# Patient Record
Sex: Female | Born: 1985 | Race: White
Health system: Northeastern US, Academic
[De-identification: ages and names within clinical notes are randomized; demographics above are authoritative.]

## PROBLEM LIST (undated history)

## (undated) DIAGNOSIS — L709 Acne, unspecified: Secondary | ICD-10-CM

## (undated) HISTORY — DX: Acne, unspecified: L70.9

---

## 2008-08-13 DIAGNOSIS — M25519 Pain in unspecified shoulder: Secondary | ICD-10-CM | POA: Insufficient documentation

## 2009-05-23 ENCOUNTER — Ambulatory Visit
Admit: 2009-05-23 | Discharge: 2009-05-23 | Disposition: A | Discharge status: Home / Self Care | Payer: Self-pay | Source: Ambulatory Visit | Attending: Gastroenterology | Admitting: Gastroenterology

## 2009-05-26 ENCOUNTER — Ambulatory Visit
Admit: 2009-05-26 | Discharge: 2009-05-26 | Disposition: A | Discharge status: Home / Self Care | Payer: Self-pay | Source: Ambulatory Visit | Attending: Gastroenterology | Admitting: Gastroenterology

## 2009-06-06 ENCOUNTER — Ambulatory Visit: Payer: Self-pay | Admitting: Gastroenterology

## 2009-07-01 ENCOUNTER — Ambulatory Visit
Admit: 2009-07-01 | Discharge: 2009-07-01 | Disposition: A | Discharge status: Home / Self Care | Payer: Self-pay | Source: Ambulatory Visit | Attending: Obstetrics | Admitting: Obstetrics

## 2009-07-04 LAB — HPV DNA PROBE WITH CYTOLOGY: HPV Hybrid Capture: NEGATIVE

## 2009-07-04 LAB — GYN CYTOLOGY

## 2009-08-05 ENCOUNTER — Ambulatory Visit
Admit: 2009-08-05 | Discharge: 2009-09-01 | Disposition: A | Discharge status: Home / Self Care | Payer: Self-pay | Source: Ambulatory Visit | Attending: Psychiatry | Admitting: Psychiatry

## 2009-09-02 ENCOUNTER — Ambulatory Visit
Admit: 2009-09-02 | Discharge: 2009-09-29 | Disposition: A | Discharge status: Home / Self Care | Payer: Self-pay | Source: Ambulatory Visit | Attending: Psychiatry | Admitting: Psychiatry

## 2009-09-17 ENCOUNTER — Other Ambulatory Visit: Payer: Self-pay

## 2009-09-30 ENCOUNTER — Ambulatory Visit
Admit: 2009-09-30 | Discharge: 2009-10-30 | Disposition: A | Discharge status: Home / Self Care | Payer: Self-pay | Source: Ambulatory Visit | Attending: Psychiatry | Admitting: Psychiatry

## 2009-10-29 ENCOUNTER — Other Ambulatory Visit: Payer: Self-pay

## 2009-11-04 ENCOUNTER — Other Ambulatory Visit: Payer: Self-pay

## 2009-12-18 ENCOUNTER — Ambulatory Visit
Admit: 2009-12-18 | Discharge: 2009-12-18 | Disposition: A | Discharge status: Home / Self Care | Payer: Self-pay | Source: Ambulatory Visit | Attending: Obstetrics | Admitting: Obstetrics

## 2009-12-23 LAB — GYN CYTOLOGY

## 2010-01-15 ENCOUNTER — Ambulatory Visit: Payer: Self-pay | Admitting: Vascular Surgery

## 2010-01-20 ENCOUNTER — Ambulatory Visit: Payer: Self-pay | Admitting: Vascular Surgery

## 2010-01-21 ENCOUNTER — Ambulatory Visit: Payer: Self-pay | Admitting: Vascular Surgery

## 2010-01-27 ENCOUNTER — Encounter: Payer: Self-pay | Admitting: Vascular Surgery

## 2010-03-03 ENCOUNTER — Ambulatory Visit
Admit: 2010-03-03 | Discharge: 2010-03-03 | Disposition: A | Discharge status: Home / Self Care | Payer: Self-pay | Source: Ambulatory Visit | Admitting: Obstetrics

## 2010-03-06 LAB — GYN CYTOLOGY

## 2010-03-09 ENCOUNTER — Encounter: Payer: Self-pay | Admitting: Vascular Surgery

## 2010-04-28 ENCOUNTER — Ambulatory Visit
Admit: 2010-04-28 | Discharge: 2010-04-28 | Disposition: A | Discharge status: Home / Self Care | Payer: Self-pay | Source: Ambulatory Visit | Attending: Obstetrics & Gynecology | Admitting: Obstetrics & Gynecology

## 2010-04-28 LAB — PREGNANCY TEST, SERUM: Preg,Serum: NEGATIVE

## 2010-04-29 LAB — RPR: RPR Screen: NONREACTIVE

## 2010-04-29 LAB — HIV-1 AND 2 AB: HIV 1&2 Ab screen: NEGATIVE

## 2010-04-29 LAB — HEB B INT

## 2010-04-29 LAB — HEPATITIS A ANTIBODY, TOTAL: Hep A Total Ab: NEGATIVE

## 2010-04-29 LAB — HEPATITIS B CORE ANTIBODY, TOTAL: HBV Core Ab: NEGATIVE

## 2010-04-29 LAB — HEPATITIS B SURFACE ANTIBODY
HBV S Ab Quant: 22.3 IU/L
HBV S Ab: POSITIVE

## 2010-04-29 LAB — HEPATITIS A ANTIBODY, IGM: Hep A IgM: NEGATIVE

## 2010-04-29 LAB — HEPATITIS B SURFACE ANTIGEN: HBV S Ag: NEGATIVE

## 2010-04-29 LAB — HEPATITIS C ANTIBODY: Hep C Ab: NEGATIVE

## 2010-04-30 LAB — HSV 1 ANTIBODY, IGG: HSV 1 IgG: 0.07 IV

## 2010-05-01 LAB — VITAMIN D
25-OH VIT D2: <4 ng/mL
25-OH VIT D3: 28 ng/mL
25-OH Vit Total: 28 ng/mL — ABNORMAL LOW (ref 30–80)

## 2010-05-08 LAB — HSV 2 ANTIBODY, IGG: HSV 2 IgG: 0.04 IV

## 2010-06-02 ENCOUNTER — Ambulatory Visit
Admit: 2010-06-02 | Discharge: 2010-06-02 | Disposition: A | Discharge status: Home / Self Care | Payer: Self-pay | Source: Ambulatory Visit | Attending: Obstetrics | Admitting: Obstetrics

## 2010-06-05 LAB — GYN CYTOLOGY

## 2010-07-31 ENCOUNTER — Other Ambulatory Visit: Payer: Self-pay | Admitting: Gastroenterology

## 2010-08-10 NOTE — Letter (Signed)
January 27, 2010    Rhina Brackett, MD  [address]  [place], [place]  [address]      RE:   Cheryl Weiss, Cheryl Weiss  DOB:  [birth year]  Unit#: [id number]    Dear Dr. Consuela Mimes:    I had the pleasure of seeing Azahria Lehmann in the [facility] today.  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  She was seen initially in our office on January 18, 2010, by Dr. Thedore Mins, at  which time she was found to have both deep and superficial venous  competency of both deep and superficial venous systems.  She presents to  the office today to begin undergoing injection sclerotherapy for cosmesis.    Review of systems:  Renya points out several scattered spider  telangiectases throughout her bilateral lower extremities.  She denies any  prior history of DVT, phlebitis, and denies any known personal or family  history of hypercoagulable disorder.  She denies pregnancy.    Allergies:  Penicillin and sulfa.    Medications:  Calcium, DepoProvera, vitamin D.    All of the reasonable risks of varicose vein sclerotherapy with Asclera by  injection, including tissue loss, phlebitis, and permanent discoloration,  along with the alternatives and benefits were discussed with the patient.  All questions were answered.  The patient indicated that these risks were  understood and consented for the proposed procedure.    I used 0.5% Asclera and performed injections to multiple spider  telangiectases involving her bilateral anterior thighs and anteromedial and  lateral calves.  I used alcohol preps beforehand and bandages afterwards,  and wrapped her legs in Ace wraps for compression.    I have informed Sarynity to leave both Ace wraps and bandages on until  tomorrow, at which time she may remove both.  I have also asked that she  avoid any sun exposure or tanning as to minimize risk for  hyperpigmentation.  She is to wear a compression stocking for the next 7 to  10  days as an adjunct to her sclerotherapy.  We will plan on seeing her  back in the office following her upcoming trip to [place] and likely be  towards the end of July.    Thank you for the opportunity to participate in this patient's care.  If we  can be of any further assistance to you, please feel free to contact the  office.        Sincerely,      Dictated by:  Derek Mound, NP  Electronically Signed and Finalized by  Derek Mound, NP 02/24/2010 18:33  ____________________________________  Derek Mound, NP        DD:   01/27/2010  DT:   01/28/2010  1:17 P  [id number]  [id number]      cc:   Rhina Brackett, MD

## 2010-08-10 NOTE — Letter (Signed)
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  Her  legs are heavy, tired, and fatigued at the end of a long day.  Exercise  worsens her symptoms.  She has painful varicosities focused on the left  lower extremity.  She has a family history of varicosities on the maternal  side.  There is no preceding history of DVT, trauma, hypercoagulable state  or edema.  She has not worn compression stockings in the past.  Her past medical history is remarkable for hepatic masses.  She has had  tympanostomy tubes in the past and adenoid resection.  She does not smoke.  She socially has 1 to 2 drinks per week.  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    She has an allergy to penicillin and sulfa.    She currently takes calcium, Depo-Provera and vitamin D.  Her 10 review of system is positive for chronic irritation of her throat,  microfractures in both femurs, and varicose veins.    Noninvasive duplex scan was performed which shows that both deep and  superficial venous systems are competent and patent both supine and  standing.  Her full physical examination is documented and can be reviewed in our  chart.  Pertinent positives include that she is afebrile with stable vital  signs and appears younger than her stated age.  She is pleasant, conversant  and in no acute distress.  Arterial exam is intact.  Neurological exam  intact.  Breath sounds are clear.  Heart is regular.  Abdomen is soft and  flat.  Both legs are warm and well perfused.  She has multiple spider veins  and reticular veins involving both lower extremities that range in size  from 1 to 3 mm.  The Brodie Trendelenburg test suggests superficial  venous  reflux.  In summary, Cheryl Weiss is a delightful young lady who has symptomatic reticular  veins and spider veins involving both lower extremities.  I have given her  a prescription for thigh-high compression stockings that should help  alleviate her symptoms.  We have also scheduled her for injection  sclerotherapy to address the numerous varicosities involving both lower  extremities.  This will be done at the [facility] later  this summer.  I suspect that 1 to 2 treatment sessions will be necessary to  address them.    I look forward to keeping you updated with Neveen's progress.  Once again,  thank you for allowing me to participate in her care.  Should you have any  questions, feel free to contact me.      Sincerely yours,                Electronically Signed and Finalized by  Teena Dunk, MD 01/29/2010 07:15  ____________________________________  Teena Dunk, MD      DD:   01/18/2010  DT:   01/18/2010  1:27 P  [id number]  [id number]      cc:   Rhina Brackett, MD

## 2010-08-28 ENCOUNTER — Ambulatory Visit: Payer: Self-pay | Admitting: Gastroenterology

## 2010-09-07 ENCOUNTER — Ambulatory Visit
Admit: 2010-09-07 | Discharge: 2010-09-07 | Disposition: A | Discharge status: Home / Self Care | Payer: Self-pay | Source: Ambulatory Visit | Attending: Gastroenterology | Admitting: Gastroenterology

## 2010-09-07 LAB — COMPREHENSIVE METABOLIC PANEL
ALT: 19 U/L (ref 0–35)
AST: 19 U/L (ref 0–35)
Albumin: 4.5 g/dL (ref 3.5–5.2)
Alk Phos: 47 U/L (ref 35–105)
Anion Gap: 10 (ref 7–16)
Bilirubin,Total: 0.5 mg/dL (ref 0.0–1.2)
CO2: 28 mmol/L (ref 20–28)
Calcium: 9.3 mg/dL (ref 9.0–10.4)
Chloride: 103 mmol/L (ref 96–108)
Creatinine: 0.83 mg/dL (ref 0.51–0.95)
GFR,Black: >59 *
GFR,Caucasian: >59 *
Glucose: 60 mg/dL — ABNORMAL LOW (ref 74–106)
Lab: 12 mg/dL (ref 6–20)
Potassium: 3.9 mmol/L (ref 3.3–5.1)
Sodium: 141 mmol/L (ref 133–145)
Total Protein: 6.7 g/dL (ref 6.3–7.7)

## 2010-09-07 LAB — CK: CK: 63 U/L (ref 34–145)

## 2010-09-07 LAB — AFP TUMOR MARKER: AFP (eff. 4-2010): 2 IU/mL (ref 0–7)

## 2010-09-09 LAB — ALDOLASE: Aldolase: 3.3 U/L (ref 1.5–8.1)

## 2010-10-07 ENCOUNTER — Ambulatory Visit
Admit: 2010-10-07 | Discharge: 2010-10-07 | Disposition: A | Discharge status: Home / Self Care | Payer: Self-pay | Source: Ambulatory Visit | Attending: Obstetrics | Admitting: Obstetrics

## 2010-10-12 LAB — GYN CYTOLOGY

## 2010-10-13 ENCOUNTER — Ambulatory Visit
Admit: 2010-10-13 | Discharge: 2010-10-13 | Disposition: A | Discharge status: Home / Self Care | Payer: Self-pay | Admitting: Gastroenterology

## 2010-10-15 NOTE — Letter (Signed)
 October 13, 2010    Quintella Baton, MD  [address] [place], [place]  [address]      RE:   Cheryl, Weiss  DOB:  07-May-1986  Unit#: [id number]    Dear Dr. Julaine Hua:    We saw Cheryl Weiss in the outpatient hepatology clinic today for  follow-up of hepatic lesions.  As you know, she is a 25 year old female who  we met in May 2010 after she was incidentally found to have 3 hepatic  lesions when she was being worked up for midepigastric pain thought to be  from reflux.  The lesions have been suggestive of focal nodular hyperplasia  versus multifocal adenomas.  We asked her to go off all oral contraceptives  and have been following her with imaging and blood work.  Since May 2010  she has had 2 MRIs of the abdomen.  The MRI in October 2010 suggested that  she continued to have the 3 lesions, 1 slightly smaller in size, 1 slightly  larger, and 1 unchanged.  They also felt they were consistent with focal  nodular hyperplasia.    Patient had recent MRI of the abdomen in January 2012 and this one revealed  the 3 enhancing lesions once again.  Two of these lesions were mildly  increased in size compared to past and one was mildly decreased.  The  lesion in the left hepatic lobe represented a degree of interval involution  or scar or interval changes in contrast appearance which were thought to be  secondary to differences in phase of contrast.  She also was found to have  2 subcentimeter hepatic cysts not changed and a splenic cyst not changed.    Patient had routine blood work drawn by her primary care physician in  January which revealed a mild transaminitis.  Patient was contacted by  phone and at that point the patient revealed that she was doing an  extensive physical workout with cardio and weight lifting.  She was also  taking multiple medications some of which can be hepatotoxic.  Her ALT was  77 and AST 60.  Prior to this her liver enzymes had all been completely  normal.  I asked her to discontinue all of her  medications with the  exception of her clindamycin topical gel and to not exercise for 1 straight  week and repeat all of her blood work.  She did repeat her blood work after  about 10 days which revealed normal liver enzymes with no evidence of  transaminitis and her CK was normal.    Patient comes in today with no complaints.  She looks and feels well.  She  denies any abdominal pain, nausea, or vomiting.  She has had no  unintentional weight loss and has no jaundice, pruritus, easy bruising, or  prolonged bleeding.  Patient reports that her reflux previously causing  epigastric pain is now gone with dietary modifications.    RELEVANT LABS:  From 09/07/2010:  CK 63, creatinine 0.83, total bilirubin  0.5, AST 19, ALT 19, alkaline phosphatase 47, aldolase 3.3,  alpha-fetoprotein 2.    IMAGING:  MRI of the abdomen with Eovist contrast:  3 enhancing liver  lesions, in the right hepatic lobe near the dome measures 2.2 x 2.1  (previously 2.0 x 1.8), second lesion in the right hepatic lobe measuring  4.5 x 3.5 (previously 4.9 x 4.1), a third lesion in the posterior aspect of  the liver at the junction of  the left and right hepatic lobe measuring 4.1  x 3.7 (previously 3.5 x 3.5).  The more superior lesions have a suggestion  of central scar and are more homogeneous in delayed images.  The more  inferior lesions demonstrate a greater degree of heterogenicity.  No new  liver lesions identified.    CURRENT MEDICATIONS:  Clindamycin gel 1%.  Patient was previously taking  and asked to discontinue niacin 500 mg, milk thistle, OxyELITE Pro, B  complex, calcium, vitamin D, and magnesium.    PHYSICAL EXAMINATION:  Blood pressure:  106/52.  Pulse:  85.  Weight:  167.  Height:  6 feet zero inches.  HEENT:  Sclerae are anicteric.  Oral mucosa  is moist.  Cor:  Regular rate and rhythm.  Lungs:  Clear.  Abdomen:  Soft,  thin, nontender.  No organomegaly.  Extremities:  No edema.  Neuro:  Alert  and oriented with no focal  defects.    ASSESSMENT AND RECOMMENDATION:  This is a 25 year old female with a history  of 3 liver lesions found incidentally in May 2010 when she was being worked  up for epigastric pain from reflux.  We have been following the lesions  closely with imaging and although 2 appear to be consistent with focal  nodular hyperplasia (patient off oral contraceptives since diagnosis) 1 has  an atypical appearance and is suggestive of adenoma versus FNH.  The lesion  in her left lobe has had slow interval growth.  Her liver enzymes are  normal as are her tumor markers.  She had mild transaminitis seen on labs  from January 2012 thought to be from over-the-counter medications including  niacin, milk thistle, and OxyELITE Pro, which she has discontinued with  normalization of her blood work.  My recommendations include:     1. Liver lesions.  At this point I have spent a great deal of time with        the patient discussing her 3 liver lesions.  We will repeat the MRI        of her abdomen in May 2012, as she is going on vacation for 2 weeks        in June.  If she has had any further growth in her liver lesions        strong consideration will be made to liver biopsy for tissue        diagnosis and/or possible resection of her lesions.  Patient is        agreeable to plan.  I will repeat her blood work in 1 month's time        and in 3 months' time to assess the stability of her liver enzymes,        as they were previously abnormal (thought to be from medications).     2. Reflux.  Patient currently has no reflux and has remained off        Nexium.     3. I will see her back for follow-up after her MRI in May 2012.    Thank you for allowing me to participate in her care.  Please do not  hesitate to call with questions or concerns at [phone].        Sincerely,      Dictated by:  Thereasa Distance, NP  Electronically Signed and Finalized by  Thereasa Distance, NP 10/16/2010 09:28  ____________________________________  Thereasa Distance, NP        DD:   10/15/2010  DT:   10/15/2010  4:24 P  [id number]  [id number]      cc:   Quintella Baton, MD

## 2011-01-01 ENCOUNTER — Other Ambulatory Visit: Payer: Self-pay | Admitting: Gastroenterology

## 2011-01-01 DIAGNOSIS — K769 Liver disease, unspecified: Secondary | ICD-10-CM

## 2011-02-09 ENCOUNTER — Ambulatory Visit: Admit: 2011-02-09 | Payer: Self-pay | Source: Ambulatory Visit | Admitting: Gastroenterology

## 2011-03-23 ENCOUNTER — Telehealth: Payer: Self-pay

## 2011-03-23 NOTE — Telephone Encounter (Signed)
Returned patient's call regarding expired prior authorization for MRI.  Imaging was originally scheduled in June but she missed the appointment. Contacted [employer] and obtained new prior authorization [id number] and called patient with authorization number so that she can reschedule MRI.  Informed her that approval will expired on 05/07/11 and provided her with phone number to [facility].

## 2011-03-26 ENCOUNTER — Other Ambulatory Visit: Payer: Self-pay | Admitting: Gastroenterology

## 2011-04-08 ENCOUNTER — Telehealth: Payer: Self-pay

## 2011-04-08 NOTE — Telephone Encounter (Signed)
Patient is scheduled for MRI on 9/12 however [employer] gave authorization for CT.  Contacted [employer] and obtained approval for MRI and authorization # is [id number] (expires 05/23/11). Provided correct authorization number to Mia in Radiology Scheduling.

## 2011-04-14 ENCOUNTER — Ambulatory Visit
Admit: 2011-04-14 | Discharge: 2011-04-14 | Disposition: A | Discharge status: Home / Self Care | Payer: Self-pay | Source: Ambulatory Visit | Attending: Gastroenterology | Admitting: Gastroenterology

## 2011-04-14 LAB — POCT URINE PREGNANCY: Lot #: 209471

## 2011-04-14 LAB — POCT CREATININE
Creatinine, POCT: 0.8 mg/dL (ref 0.51–0.95)
GFR, OTHER, POC: >59

## 2011-04-20 ENCOUNTER — Telehealth: Payer: Self-pay | Admitting: Gastroenterology

## 2011-04-21 NOTE — Telephone Encounter (Signed)
Patient called back for MRI results.  She is extremely anxious.

## 2011-04-22 ENCOUNTER — Telehealth: Payer: Self-pay | Admitting: Gastroenterology

## 2011-05-20 NOTE — Telephone Encounter (Signed)
Sent to provider 

## 2011-07-09 ENCOUNTER — Ambulatory Visit
Admit: 2011-07-09 | Discharge: 2011-07-09 | Disposition: A | Discharge status: Home / Self Care | Payer: Self-pay | Source: Ambulatory Visit | Attending: Obstetrics | Admitting: Obstetrics

## 2011-07-19 LAB — HPV DNA PROBE WITH CYTOLOGY: HPV Hybrid Capture: NEGATIVE

## 2011-07-22 LAB — GYN CYTOLOGY

## 2011-09-13 ENCOUNTER — Encounter: Payer: Self-pay | Admitting: Dermatology

## 2011-09-13 ENCOUNTER — Ambulatory Visit: Payer: Self-pay | Admitting: Dermatology

## 2011-09-13 VITALS — BP 100/70 | Ht 72.0 in | Wt 168.0 lb

## 2011-09-13 DIAGNOSIS — L709 Acne, unspecified: Secondary | ICD-10-CM

## 2011-09-13 MED ORDER — CLINDAMYCIN PHOS-BENZOYL PEROX 1-5 % EX GEL *A*
Freq: Two times a day (BID) | CUTANEOUS | Status: DC
Start: 2011-09-13 — End: 2013-06-06

## 2011-09-13 MED ORDER — MINOCYCLINE HCL 100 MG PO CAPS *I*
100.0000 mg | ORAL_CAPSULE | Freq: Two times a day (BID) | ORAL | Status: AC
Start: 2011-09-13 — End: 2011-10-13

## 2011-09-13 MED ORDER — TRETINOIN 0.025 % EX CREA *I*
TOPICAL_CREAM | Freq: Every evening | CUTANEOUS | Status: DC
Start: 2011-09-13 — End: 2013-06-06

## 2011-09-13 NOTE — Patient Instructions (Addendum)
Acne  Start Tretinoin 0.025 : Use a pea sized amount every other night for two weeks, then advance to nightly as tolerated. Wash off in the morning. May cause drying or redness.  Start Benzaclin: Use once in the morning. Can bleach clothing   Take mincocycline 100mg  by mouth twice daily. Take WITH food, this will help prevent stomach upset. Avoid increased sun exposure while on this medication. Alert [place] if you experience dizziness or headache. Pigmentation changes may occur if this medication is used for an extended period of time. Do not become pregnant on this medication.   -Use moisturizer made for the face. These products should say "non-comedogenic" or "won't clog pores". SPF >30 for daily use.  Reapply frequently  while in the sun or active.     Follow up in 3 months

## 2011-09-13 NOTE — Progress Notes (Addendum)
Consulting Physician:   Quintella Baton    CC: acne      Identifiers:  2 Patient identification verification occurred.  Barriers to learning:  None    HPI:  This is a very pleasant 26 year old female here as a new patient for acne.  She reports that she was on oral contraceptive for several years, however 2 years ago she had to discontinue it because she was found to have focal nodular hyperplasia of the liver that was caused by estrogen in the birth control. When she stopped the OCP, her acne became terrible. Strong family history of bad acne in brother and sister. She has tried proactive and topical benzaclin, but these were not sufficient to control her acne.     ROS:   Otherwise well, other than the above stated in the HPI. No other skin concerns       Past Medical/Surgical History: The patient's medical record and intake form from today were reviewed.  No medical problems  Medications: Multivitamin  Allergies: Sulfa, penicillin    Family History: The intake form from today was reviewed.    No melanoma or nonmelanoma skin cancers.      Social History:    The intake form from today was reviewed.  Patient drinks alcohol 1-2 times weekly.  Denies IV drug or tobacco use.  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     Physical Exam:    The patient is alert, well-nourished, well-developed, no acute distress   All of the following were examined and were within normal limits, except as noted:     Face, Ears, Neck:  --On the cheeks, chin, forehead and nose there are multiple inflammatory papules, open and closed comedones.  Intermixed is postinflammatory hyperpigmentation.  Scalp,Hair, Eyebrows:  Eyes, Lids, Conjunctivae:     Chest, Anterior and posterior trunk:  A few inflammatory papules on the back.  Chest is clear          Assessment/Plan:    1.Acne  Start Tretinoin 0.025 : Use a pea sized amount every other night for two weeks, then advance to nightly as tolerated. Wash off in the morning. May cause  drying or redness.  Start Benzaclin: Use once in the morning. Can bleach clothing   Take mincocycline 100mg  by mouth twice daily.  Discussed side effects      Follow up in 3 months         Francisco Capuchin, MD    Agree with note of Dr.Moon. Patient evaluated jointly.  Would manage as noted.    Edwardine Deschepper Arlis Porta, MD

## 2011-11-18 ENCOUNTER — Other Ambulatory Visit: Payer: Self-pay | Admitting: Gastroenterology

## 2011-11-18 DIAGNOSIS — R748 Abnormal levels of other serum enzymes: Secondary | ICD-10-CM

## 2011-12-28 ENCOUNTER — Ambulatory Visit: Payer: Self-pay | Admitting: Dermatology

## 2012-03-02 ENCOUNTER — Ambulatory Visit: Payer: Self-pay | Admitting: Surgery

## 2012-03-02 ENCOUNTER — Encounter: Payer: Self-pay | Admitting: Surgery

## 2012-03-02 VITALS — BP 114/76 | Ht 72.0 in | Wt 153.0 lb

## 2012-03-02 DIAGNOSIS — L578 Other skin changes due to chronic exposure to nonionizing radiation: Secondary | ICD-10-CM

## 2012-03-02 DIAGNOSIS — L709 Acne, unspecified: Secondary | ICD-10-CM

## 2012-03-02 DIAGNOSIS — D229 Melanocytic nevi, unspecified: Secondary | ICD-10-CM | POA: Insufficient documentation

## 2012-03-02 NOTE — Patient Instructions (Signed)
A- Asymmetry  B- Borders  C- Color  D- Diameter bigger than a pencil eraser head    We don't like red, or bluish black or rapidly growing lesions  If it's new and hasn't gone away in a month please call  If it looks like many other lesions on your body it's probably nothing to worry about.

## 2012-03-02 NOTE — Progress Notes (Signed)
Chief Complaint   Patient presents with   . Follow-up     mole R buttock   . Acne         Identifiers:  2 Patient identification verification occurred.  Barriers to learning:  None    HPI:  Cheryl Weiss is a 26 y.o. year old female following up for acne.  At last visit the patient was treated with minocycline, benzaclin and tretinoin.  She stopped taking the minocycline d/t diarrhea and gi upset.  She has a mole of the right buttock that has more than two colors in it.  Otherwise asymptomatic.  One lesion of the buttock near the crease.  Not symptomatic    ROS:   Otherwise well, other than the above stated in the HPI.    PMFSH: Reviewed. No changes.  Meds:  Reviewed  Physical Exam:    The patient is alert, well-nourished, well-developed, no acute distress   All of the following were examined and were within normal limits, except as noted:     Face, Ears, Neck:  Atrophic scars of the face, few erythematous papules, extremely tanned skin.  One small 3mm round med brown homogenous papule of the right buttock.  One fibromatous papule 0.5 cm of the right buttock    Assessment/Plan:    1.  Acne, well controlled on topicals, d/c mino.  F/u one year  2.  Nevus and Dermatofibroma benign.  Ed. On the ABCD's of skin cancer     Follow up prn

## 2013-04-25 ENCOUNTER — Ambulatory Visit
Admit: 2013-04-25 | Discharge: 2013-04-25 | Disposition: A | Discharge status: Home / Self Care | Payer: Self-pay | Source: Ambulatory Visit | Attending: Obstetrics and Gynecology | Admitting: Obstetrics and Gynecology

## 2013-04-26 LAB — BHCG, QUANT PREGNANCY

## 2013-06-06 ENCOUNTER — Ambulatory Visit: Payer: Self-pay | Admitting: Dermatology

## 2013-06-06 ENCOUNTER — Encounter: Payer: Self-pay | Admitting: Dermatology

## 2013-06-06 VITALS — Ht 72.0 in | Wt 155.0 lb

## 2013-06-06 DIAGNOSIS — L709 Acne, unspecified: Secondary | ICD-10-CM

## 2013-06-06 DIAGNOSIS — B351 Tinea unguium: Secondary | ICD-10-CM

## 2013-06-06 MED ORDER — KETOCONAZOLE 2 % EX CREA *I*
TOPICAL_CREAM | Freq: Every day | CUTANEOUS | Status: AC
Start: 2013-06-06 — End: ?

## 2013-06-06 MED ORDER — TRETINOIN 0.025 % EX CREA *I*
TOPICAL_CREAM | Freq: Every evening | CUTANEOUS | Status: AC
Start: 2013-06-06 — End: ?

## 2013-06-06 MED ORDER — CIPROFLOXACIN HCL 500 MG PO TABS *I*
500.0000 mg | ORAL_TABLET | Freq: Two times a day (BID) | ORAL | Status: AC
Start: 2013-06-06 — End: 2013-06-16

## 2013-06-06 MED ORDER — CLINDAMYCIN PHOS-BENZOYL PEROX 1-5 % EX GEL *A*
Freq: Two times a day (BID) | CUTANEOUS | Status: AC
Start: 2013-06-06 — End: ?

## 2013-06-06 NOTE — Patient Instructions (Addendum)
1. Green discoloration of the left great toenail: Onychomycosis (toenail fungus) vs pseudomonas or canidiasis   --Culture of nail taken today (takes 2-4 weeks to grow)   --Ciprofloxacin 500mg  twice daily for 10 days   --If your GI doctor is ok with terbinafine (lamisil), pending culture results, this would be best to treat if fungus.   --Also check if fluconazole is ok, in case of yeast infection (Candidiasis)   --When checking blood work with PCP, make sure you get a CBCd and a CMP   --Start vinegar soaks: 1/4 cup vinegar, 3/4 cup water   --Start ketoconazole cream twice daily     2. Acne   --Continue benzaclin and retin-a       Follow up in 2 months

## 2013-06-06 NOTE — Progress Notes (Signed)
Chief Complaint   Patient presents with   . Follow-up     L big toe nail discoloration          Identifiers:  2 Patient identification verification occurred.  Barriers to learning:  None    HPI:  Cheryl Weiss is a 27 y.o. year old female following up for the above. She has also been treated by our practice for acne, last seen in August 2013. Does well with tretinoin and benzaclin. WOuld like refills. Also has issues with toe pain and green discoloration. Has been progressively worsening over the past few months. 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 She has benign liver tumors and history of elevated LFTs. Has not seen GI in a few years.       ROS:   Otherwise well, other than the above stated in the HPI.    PMFSH: Reviewed. No changes.  Meds:  Reviewed  Physical Exam:    The patient is alert, well-nourished, well-developed, no acute distress   All of the following were examined and were within normal limits, except as noted:     Face, Ears, Neck:  Atrophic scars of the face, few erythematous papules   --left great toenail with dystrohpy, green discoloration, assoicated paryhoncyhia     Assessment/Plan:   1. Green discoloration of the left great toenail: Onychomycosis (toenail fungus) vs pseudomonas or canidiasis   --Culture of nail taken today (takes 2-4 weeks to grow)   --Ciprofloxacin 500mg  twice daily for 10 days   --If your GI doctor is ok with terbinafine (lamisil), pending culture results, this would be best to treat if fungus.   --Also check if fluconazole is ok, in case of yeast infection (Candidiasis)   --When checking blood work with PCP, make sure you get a CBCd and a CMP   --Start vinegar soaks: 1/4 cup vinegar, 3/4 cup water   --Start ketoconazole cream twice daily     2. Acne   --Continue benzaclin and retin-a       Follow up in 2 months

## 2013-06-25 LAB — FUNGUS CULTURE: Fungal Culture: 0

## 2013-07-13 ENCOUNTER — Emergency Department
Admit: 2013-07-13 | Discharge: 2013-07-14 | Disposition: A | Discharge status: Home / Self Care | Payer: Self-pay | Source: Ambulatory Visit | Attending: Emergency Medicine | Admitting: Emergency Medicine

## 2013-07-13 DIAGNOSIS — N390 Urinary tract infection, site not specified: Secondary | ICD-10-CM

## 2013-07-13 LAB — POCT URINE PREGNANCY: Exp date: 700888

## 2013-07-13 LAB — URINALYSIS, DIPSTICK ONLY (STRONG WEST)
Ketones, UA: NEGATIVE
Nitrite,UA: POSITIVE — AB
Protein,UA: 100 mg/dL — AB
Specific Gravity,UA: 1.01 (ref 1.002–1.030)
pH,UA: 6.5 (ref 5.0–8.0)

## 2013-07-13 LAB — HM HIV SCREENING OFFERED

## 2013-07-13 MED ORDER — KETOROLAC TROMETHAMINE 30 MG/ML IJ SOLN *I*
60.0000 mg | Freq: Once | INTRAMUSCULAR | Status: AC
Start: 2013-07-14 — End: 2013-07-13
  Administered 2013-07-13: 60 mg via INTRAMUSCULAR
  Filled 2013-07-13: qty 2

## 2013-07-13 NOTE — ED Notes (Signed)
Reports history of UTI.  Reports same symptoms and believes has stress induced UTI due to final exams.  Reports increased urgency and frequency.  Reports pain L/R flank without CVA tenderness.  Dishcarge white, clumpy with blood.  Denies fever.  Reports headache x 1 day.

## 2013-07-13 NOTE — ED Notes (Signed)
Patient presents to the ED with complaint of UTI symptoms, patient states she has a history of UTI's.  Patient states she has had burning when she urinated for the past 2 days, states she noticed blood in her urine in the past 4 hours and has had an increase in frequency in the past 4 hours.  Patient states there is an odor, also states she has white discharge with blood.  Patient denies any abdominal pain.  Patient states she has right and left flank pain that started in the past 4 hours, patient does not have CVA tenderness.  Patient denies any nausea or vomiting.  Patient states she has had a headache today, denies any dizziness, lightheadedness, blurry vision or double vision.  Patient changing into a gown.

## 2013-07-14 ENCOUNTER — Encounter: Payer: Self-pay | Admitting: Emergency Medicine

## 2013-07-14 MED ORDER — CIPROFLOXACIN HCL 500 MG PO TABS *I*
500.0000 mg | ORAL_TABLET | Freq: Once | ORAL | Status: AC
Start: 2013-07-14 — End: 2013-07-14
  Administered 2013-07-14: 500 mg via ORAL
  Filled 2013-07-14: qty 1

## 2013-07-14 MED ORDER — PHENAZOPYRIDINE HCL 100 MG PO TABS *I*
200.0000 mg | ORAL_TABLET | Freq: Once | ORAL | Status: AC
Start: 2013-07-14 — End: 2013-07-14
  Administered 2013-07-14: 200 mg via ORAL
  Filled 2013-07-14: qty 2

## 2013-07-14 MED ORDER — CIPROFLOXACIN HCL 500 MG PO TABS *I*
500.0000 mg | ORAL_TABLET | Freq: Two times a day (BID) | ORAL | Status: AC
Start: 2013-07-14 — End: 2013-07-21

## 2013-07-14 NOTE — Discharge Instructions (Signed)
Take Cipro twice daily for 7 days.  Take pyridium for bladder pain- this is available over the counter. Ask pharmacist.  Cheryl Weiss had a dose of both tonight.  If you are not feeling better in 48 hours, return to the ED.

## 2013-07-14 NOTE — ED Provider Notes (Signed)
History     Chief Complaint   Patient presents with   . Urinary Tract Infection     HPI Comments: CC: dysuria  HPI: 27yo female with burning pain when she urinates, some hematuria, urgency, frequency and pain in both flanks that comes and goes. She has hx of utis with stress. No fevers. Positive chills.       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 Acne             History reviewed. No pertinent past surgical history.    History reviewed. No pertinent family history.      Social History      reports that she has never smoked. She does not have any smokeless tobacco history on file. She reports that she drinks about 0.6 ounces of alcohol per week. She reports that she does not use illicit drugs. Her sexual activity history is not on file.    Living Situation    Questions Responses    Patient lives with     Homeless     Caregiver for other family member     External Services     Employment     Domestic Violence Risk           Problem List     Patient Active Problem List   Diagnosis Code   . Compression Arthralgia Of The Shoulder Region 719.41   . Acne 706.1   . Nevus 216.9   . Sun-damaged skin 692.79       Review of Systems   Review of Systems   Constitutional: Negative for fever, chills, diaphoresis, activity change, fatigue and unexpected weight change.   HENT: Negative for hearing loss, nosebleeds, congestion, rhinorrhea, neck pain, neck stiffness and voice change.    Eyes: Negative for photophobia and visual disturbance.   Respiratory: Negative for apnea, cough, choking, chest tightness, shortness of breath, wheezing and stridor.    Cardiovascular: Negative for chest pain, palpitations and leg swelling.   Gastrointestinal: Negative for nausea, vomiting, abdominal pain, diarrhea, constipation, blood in stool, abdominal distention and anal bleeding.   Genitourinary:  Positive for dysuria, urgency, frequency, hematuria and flank pain. Negative for decreased urine volume, vaginal bleeding, vaginal discharge, difficulty urinating, vaginal pain, menstrual problem and pelvic pain.   Musculoskeletal: Negative for myalgias, back pain and arthralgias.   Skin: Negative for color change, rash and wound.   Neurological: Negative.  Negative for weakness and light-headedness.   Psychiatric/Behavioral: Negative for suicidal ideas, behavioral problems, sleep disturbance, self-injury and agitation. The patient is not nervous/anxious and is not hyperactive.        Physical Exam     ED Triage Vitals   BP Heart Rate Heart Rate(via Pulse Ox) Resp Temp Temp Source SpO2 O2 Device O2 Flow Rate   07/13/13 2323 07/13/13 2323 07/13/13 2323 -- 07/13/13 2323 07/13/13 2323 07/13/13 2323 07/13/13 2323 --   128/73 mmHg 144 144  37.1 C (98.8 F) TEMPORAL 100 % None (Room air)       Weight           07/13/13 2323           70.308 kg (155 lb)               Physical Exam  Nursing note and vitals reviewed.  Constitutional: She is oriented to person, place, and time. She appears well-developed and well-nourished.   HENT:   Head: Normocephalic and atraumatic.   Nose: Nose normal.   Mouth/Throat: Oropharynx is clear and moist.   Eyes: Conjunctivae and EOM are normal. Pupils are equal, round, and reactive to light.   Neck: Normal range of motion. Neck supple.   Cardiovascular: Normal rate, regular rhythm, normal heart sounds and intact distal pulses.    Pulmonary/Chest: Effort normal and breath sounds normal. No stridor. She has no wheezes. She has no rales.   Abdominal: Soft. Bowel sounds are normal. She exhibits no distension. There is no tenderness. There is CVA tenderness. There is no rebound and no guarding.   Musculoskeletal: Normal range of motion.   Neurological: She is alert and oriented to person, place, and time.   Skin: Skin is dry.   Psychiatric: She has a normal mood and affect. Her behavior is normal.  Judgment and thought content normal.       Medical Decision Making   <EDMDM>    Initial Evaluation:  ED First Provider Contact    Date/Time Event User Comments    07/13/13 2327 ED Provider First Contact Chalice Philbert S Initial Face to Face Provider Contact          Patient seen by me on arrival date of 07/13/2013 at at time of arrival  11:17 PM.  Initial face to face evaluation time noted above may be discrepant due to patient acuity and delay in documentation.    Assessment:  27 y.o., female comes to the ED with dysuria    Differential Diagnosis includes uti, pyelo              Plan: ua, ucx, urine preg    Labs Reviewed   URINALYSIS, DIPSTICK ONLY ([facility]) - Abnormal; Notable for the following:     Blood,UA 3+ (*)     Protein,UA 100 (*)     Nitrite,UA POS (*)     Leuk Esterase,UA 1+ (*)     All other components within normal limits   AEROBIC CULTURE   POCT URINE PREGNANCY     Pt has urine dip consistent with UTI. Given flank pain want to cover for pyelo. She is tolerating PO so should be able to be treated as outpt. We discussed due to allergies, cipro is preferred over macrobid as two main agents due to poor kidney penetration of macrobid. We discussed risk of cipro causing ligament injury. She understands. Will take cipro for 3 days and if not better continue for full 7. Culture here so if not feeling better in 48 hours should return or see her doctor.      Harrietta Guardian, MD          Harrietta Guardian, MD  07/14/13 [phone]

## 2013-07-16 LAB — AEROBIC CULTURE

## 2013-08-07 ENCOUNTER — Ambulatory Visit: Payer: Self-pay | Admitting: Dermatology

## 2014-11-29 ENCOUNTER — Ambulatory Visit
Admit: 2014-11-29 | Discharge: 2014-11-29 | Disposition: A | Discharge status: Home / Self Care | Payer: Self-pay | Source: Ambulatory Visit | Attending: Internal Medicine | Admitting: Internal Medicine

## 2014-11-29 LAB — TIBC
Iron: 126 ug/dL (ref 34–165)
TIBC: 349 ug/dL (ref 250–450)
Transferrin Saturation: 36 % (ref 15–50)

## 2014-11-29 LAB — CBC
Hematocrit: 43 % (ref 34–45)
Hemoglobin: 14.4 g/dL (ref 11.2–15.7)
MCH: 33 pg/cell — ABNORMAL HIGH (ref 26–32)
MCHC: 34 g/dL (ref 32–36)
MCV: 99 fL — ABNORMAL HIGH (ref 79–95)
Platelets: 257 10*3/uL (ref 160–370)
RBC: 4.3 MIL/uL (ref 3.9–5.2)
RDW: 12.7 % (ref 11.7–14.4)
WBC: 5.9 10*3/uL (ref 4.0–10.0)

## 2014-11-29 LAB — FERRITIN: Ferritin: 32 ng/mL (ref 10–120)

## 2014-11-29 LAB — TSH: TSH: 1.35 u[IU]/mL (ref 0.27–4.20)

## 2015-10-22 ENCOUNTER — Emergency Department: Payer: Medicaid - Out of State

## 2015-10-22 ENCOUNTER — Emergency Department
Admission: EM | Admit: 2015-10-22 | Discharge: 2015-10-22 | Disposition: A | Discharge status: Home / Self Care | Payer: Medicaid - Out of State | Attending: Emergency Medicine | Admitting: Emergency Medicine

## 2015-10-22 ENCOUNTER — Encounter: Payer: Self-pay | Admitting: Emergency Medicine

## 2015-10-22 DIAGNOSIS — J209 Acute bronchitis, unspecified: Secondary | ICD-10-CM | POA: Diagnosis not present

## 2015-10-22 DIAGNOSIS — Z880 Allergy status to penicillin: Secondary | ICD-10-CM | POA: Diagnosis not present

## 2015-10-22 DIAGNOSIS — J0100 Acute maxillary sinusitis, unspecified: Secondary | ICD-10-CM | POA: Insufficient documentation

## 2015-10-22 DIAGNOSIS — R05 Cough: Secondary | ICD-10-CM | POA: Diagnosis present

## 2015-10-22 MED ORDER — ALBUTEROL SULFATE (2.5 MG/3ML) 0.083% IN NEBU
2.5000 mg | INHALATION_SOLUTION | Freq: Once | RESPIRATORY_TRACT | Status: AC
  Administered 2015-10-22: 2.5 mg via RESPIRATORY_TRACT
  Filled 2015-10-22: qty 3

## 2015-10-22 MED ORDER — GUAIFENESIN-CODEINE 100-10 MG/5ML PO SYRP: 5.0000 mL | ORAL_SOLUTION | Freq: Three times a day (TID) | ORAL | Status: AC | PRN

## 2015-10-22 MED ORDER — PREDNISONE 10 MG PO TABS: 50.0000 mg | ORAL_TABLET | Freq: Every day | ORAL | Status: AC

## 2015-10-22 MED ORDER — AZITHROMYCIN 250 MG PO TABS: ORAL_TABLET | ORAL | Status: AC

## 2015-10-22 MED ORDER — ALBUTEROL SULFATE HFA 108 (90 BASE) MCG/ACT IN AERS: 2.0000 | INHALATION_SPRAY | RESPIRATORY_TRACT | Status: AC | PRN

## 2015-10-22 NOTE — ED Notes (Signed)
Cough for about a week now.

## 2015-10-22 NOTE — Discharge Instructions (Signed)

## 2015-10-22 NOTE — ED Provider Notes (Signed)
[facility] Emergency Department Provider Note  ____________________________________________  Time seen: Approximately 3:16 PM  I have reviewed the triage vital signs and the nursing notes.   HISTORY  Chief Complaint Cough   HPI Shirley Perkins is a 30 y.o. female who presents to the emergency department for evaluation of cough and nasal congestion for the past week. She states that she has been taking Zyrtec without relief. She states that her symptoms have not improved at all and are actually worsening. She has a productive cough of thick green sputum. She denies history of asthma or smoking, but tells me that she has noticed wheezing that worsens at night.   History reviewed. No pertinent past medical history.  There are no active problems to display for this patient.   History reviewed. No pertinent past surgical history.  Current Outpatient Rx  Name  Route  Sig  Dispense  Refill  . albuterol (PROVENTIL HFA;VENTOLIN HFA) 108 (90 Base) MCG/ACT inhaler   Inhalation   Inhale 2 puffs into the lungs every 4 (four) hours as needed for wheezing or shortness of breath.   1 Inhaler   1   . azithromycin (ZITHROMAX) 250 MG tablet      2 tablets today, then 1 tablet for the next 4 days.   6 each   0   . guaiFENesin-codeine (ROBITUSSIN AC) 100-10 MG/5ML syrup   Oral   Take 5 mLs by mouth 3 (three) times daily as needed for cough.   120 mL   0   . predniSONE (DELTASONE) 10 MG tablet   Oral   Take 5 tablets (50 mg total) by mouth daily.   25 tablet   0     Allergies Penicillins and Sulfa antibiotics  No family history on file.  Social History Social History  Substance Use Topics  . Smoking status: Never Smoker   . Smokeless tobacco: None  . Alcohol Use: No    Review of Systems Constitutional: No fever/chills Eyes: No visual changes. ENT: No sore throat. Cardiovascular: Denies chest pain. Respiratory: Negative for shortness of breath.  Positive for cough. Gastrointestinal: Negative for abdominal pain. No nausea,  no vomiting.  No diarrhea.  Genitourinary: Negative for dysuria. Musculoskeletal: Negative for body aches Skin: Negative for rash. Neurological: Positive for headaches, Negative for focal weakness or numbness. ____________________________________________   PHYSICAL EXAM:  VITAL SIGNS: ED Triage Vitals  Enc Vitals Group     BP 10/22/15 1428 140/87 mmHg     Pulse Rate 10/22/15 1427 87     Resp 10/22/15 1427 18     Temp 10/22/15 1427 98.1 F (36.7 C)     Temp Source 10/22/15 1427 Oral     SpO2 10/22/15 1427 100 %     Weight 10/22/15 1427 150 lb (68.04 kg)     Height 10/22/15 1427 6' (1.829 m)     Head Cir --      Peak Flow --      Pain Score --      Pain Loc --      Pain Edu? --      Excl. in GC? --     Constitutional: Alert and oriented. Well appearing and in no acute distress. Eyes: Conjunctivae are normal. PERRL. EOMI. Ears: Tympanic membranes normal Head: Atraumatic. Nose: Maxillary tenderness, nasal congestion noted without rhinorrhea Mouth/Throat: Mucous membranes are moist.  Oropharynx nonerythematous with out tonsillar exudate or edema. Neck: No stridor.  Lymphatic: No cervical lymphadenopathy. Cardiovascular: Normal  rate, regular rhythm. Grossly normal heart sounds.  Good peripheral circulation. Respiratory: Normal respiratory effort.  No retractions. Expiratory wheezes noted in the bilateral bases. Gastrointestinal: Soft and nontender. No distention. No abdominal bruits. No CVA tenderness. Musculoskeletal: No joint pain reported. Neurologic:  Normal speech and language. No gross focal neurologic deficits are appreciated. Speech is normal. No gait instability. Skin:  Skin is warm, dry and intact. No rash noted. Psychiatric: Mood and affect are normal. Speech and behavior are normal.  ____________________________________________   LABS (all labs ordered are listed, but only abnormal  results are displayed)  Labs Reviewed - No data to display ____________________________________________  EKG   ____________________________________________  RADIOLOGY  Chest x-ray negative for acute cardiopulmonary abnormality per radiology. ____________________________________________   PROCEDURES  Procedure(s) performed: None  Critical Care performed: No  ____________________________________________   INITIAL IMPRESSION / ASSESSMENT AND PLAN / ED COURSE  Pertinent labs & imaging results that were available during my care of the patient were reviewed by me and considered in my medical decision making (see chart for details).   Patient will be treated with azithromycin, prednisone, albuterol, and Robitussin-AC. She is to follow-up with the primary care provider of her choice for symptoms that are not improving over the next 2-3 days. She was advised to return to the emergency department for symptoms that change or worsen if she is unable schedule an appointment. ____________________________________________   FINAL CLINICAL IMPRESSION(S) / ED DIAGNOSES  Final diagnoses:  Bronchitis, acute, with bronchospasm  Acute maxillary sinusitis, recurrence not specified       Chinita Pester, FNP 10/22/15 1701  Maurilio Lovely, MD 10/23/15 9604

## 2016-12-09 ENCOUNTER — Other Ambulatory Visit
Admission: RE | Admit: 2016-12-09 | Discharge: 2016-12-09 | Disposition: A | Discharge status: Home / Self Care | Payer: Medicaid Other | Source: Ambulatory Visit | Attending: Family Medicine | Admitting: Family Medicine

## 2016-12-09 DIAGNOSIS — Z832 Family history of diseases of the blood and blood-forming organs and certain disorders involving the immune mechanism: Secondary | ICD-10-CM | POA: Insufficient documentation

## 2016-12-09 DIAGNOSIS — Z113 Encounter for screening for infections with a predominantly sexual mode of transmission: Secondary | ICD-10-CM | POA: Insufficient documentation

## 2016-12-09 DIAGNOSIS — F1020 Alcohol dependence, uncomplicated: Secondary | ICD-10-CM | POA: Insufficient documentation

## 2016-12-09 DIAGNOSIS — Z114 Encounter for screening for human immunodeficiency virus [HIV]: Secondary | ICD-10-CM | POA: Insufficient documentation

## 2016-12-09 LAB — AST: AST: 27 U/L (ref 0–35)

## 2016-12-09 LAB — ALT: ALT: 35 U/L (ref 0–35)

## 2016-12-09 LAB — MULTIPLE ORDERING DOCS

## 2016-12-09 LAB — T BILI: Bilirubin,Total: <0.2 mg/dL (ref 0.0–1.2)

## 2016-12-10 LAB — SYPHILIS SCREEN
Syphilis Screen: NEGATIVE
Syphilis Status: NONREACTIVE

## 2016-12-10 LAB — FACTOR 7 ASSAY: Factor VII: 127 % ACTIVITY (ref 66–159)

## 2016-12-10 LAB — ACT PROT C REST: Activated Protein C Resistance: 1.7 — AB

## 2016-12-10 LAB — TREPONEMA PALLIDUM

## 2016-12-10 LAB — SPEC COAG REVIEW

## 2016-12-10 LAB — REVIEWED BY:

## 2016-12-10 LAB — HEPATITIS C ANTIBODY: Hep C Ab: NEGATIVE

## 2016-12-10 LAB — INTERPRETATION,SPEC COAG

## 2016-12-10 LAB — HIV 1&2 ANTIGEN/ANTIBODY: HIV 1&2 ANTIGEN/ANTIBODY: NONREACTIVE

## 2017-02-15 ENCOUNTER — Encounter: Payer: Self-pay | Admitting: Psychiatry

## 2017-02-15 ENCOUNTER — Other Ambulatory Visit: Payer: Self-pay | Admitting: Cardiology

## 2017-02-15 ENCOUNTER — Emergency Department
Admission: EM | Admit: 2017-02-15 | Discharge: 2017-02-15 | Disposition: A | Discharge status: Home / Self Care | Payer: Medicaid Other | Source: Ambulatory Visit | Attending: Emergency Medicine | Admitting: Emergency Medicine

## 2017-02-15 DIAGNOSIS — F10120 Alcohol abuse with intoxication, uncomplicated: Secondary | ICD-10-CM | POA: Insufficient documentation

## 2017-02-15 DIAGNOSIS — R410 Disorientation, unspecified: Secondary | ICD-10-CM

## 2017-02-15 DIAGNOSIS — F1010 Alcohol abuse, uncomplicated: Secondary | ICD-10-CM

## 2017-02-15 DIAGNOSIS — I499 Cardiac arrhythmia, unspecified: Secondary | ICD-10-CM

## 2017-02-15 DIAGNOSIS — Z008 Encounter for other general examination: Secondary | ICD-10-CM

## 2017-02-15 DIAGNOSIS — R419 Unspecified symptoms and signs involving cognitive functions and awareness: Secondary | ICD-10-CM

## 2017-02-15 DIAGNOSIS — R42 Dizziness and giddiness: Secondary | ICD-10-CM

## 2017-02-15 LAB — PLASMA PROF 7 (ED ONLY)
Anion Gap,PL: 14 (ref 7–16)
CO2,Plasma: 26 mmol/L (ref 20–28)
Chloride,Plasma: 101 mmol/L (ref 96–108)
Creatinine: 0.67 mg/dL (ref 0.51–0.95)
GFR,Black: 136 *
GFR,Caucasian: 118 *
Glucose,Plasma: 92 mg/dL (ref 60–99)
Potassium,Plasma: 4 mmol/L (ref 3.4–4.7)
Sodium,Plasma: 141 mmol/L (ref 133–145)
UN,Plasma: 10 mg/dL (ref 6–20)

## 2017-02-15 LAB — CBC AND DIFFERENTIAL
Baso # K/uL: 0.1 10*3/uL (ref 0.0–0.1)
Basophil %: 0.7 %
Eos # K/uL: 0 10*3/uL (ref 0.0–0.4)
Eosinophil %: 0.1 %
Hematocrit: 43 % (ref 34–45)
Hemoglobin: 14 g/dL (ref 11.2–15.7)
IMM Granulocytes #: 0 10*3/uL (ref 0.0–0.1)
IMM Granulocytes: 0.2 %
Lymph # K/uL: 1.5 10*3/uL (ref 1.2–3.7)
Lymphocyte %: 18 %
MCH: 33 pg/cell — ABNORMAL HIGH (ref 26–32)
MCHC: 33 g/dL (ref 32–36)
MCV: 100 fL — ABNORMAL HIGH (ref 79–95)
Mono # K/uL: 0.3 10*3/uL (ref 0.2–0.9)
Monocyte %: 3.5 %
Neut # K/uL: 6.2 10*3/uL — ABNORMAL HIGH (ref 1.6–6.1)
Nucl RBC # K/uL: 0 10*3/uL (ref 0.0–0.0)
Nucl RBC %: 0 /100 WBC (ref 0.0–0.2)
Platelets: 353 10*3/uL (ref 160–370)
RBC: 4.3 MIL/uL (ref 3.9–5.2)
RDW: 11.8 % (ref 11.7–14.4)
Seg Neut %: 77.5 %
WBC: 8.1 10*3/uL (ref 4.0–10.0)

## 2017-02-15 LAB — RUQ PANEL (ED ONLY)
Albumin: 4.9 g/dL (ref 3.5–5.2)
Amylase: 42 U/L (ref 28–100)
Bilirubin,Direct: <0.2 mg/dL (ref 0.0–0.3)
Bilirubin,Total: 0.5 mg/dL (ref 0.0–1.2)
Lipase: 28 U/L (ref 13–60)
Total Protein: 7.6 g/dL (ref 6.3–7.7)

## 2017-02-15 LAB — HOLD BLUE

## 2017-02-15 LAB — ACETAMINOPHEN LEVEL: Acetaminophen: <5 ug/mL

## 2017-02-15 LAB — MAGNESIUM: Magnesium: 1.7 mEq/L (ref 1.3–2.1)

## 2017-02-15 LAB — SALICYLATE LEVEL: Salicylate: <2 mg/dL — ABNORMAL LOW (ref 15.0–30.0)

## 2017-02-15 LAB — CALCIUM: Calcium: 9.5 mg/dL (ref 8.8–10.2)

## 2017-02-15 LAB — PREGNANCY TEST, SERUM: Preg,Serum: NEGATIVE

## 2017-02-15 LAB — ETHANOL: Ethanol: <10 mg/dL (ref 0–9)

## 2017-02-15 MED ORDER — PROMETHAZINE HCL 25 MG/ML IJ SOLN *I*
12.5000 mg | Freq: Once | INTRAMUSCULAR | Status: AC
Start: 2017-02-15 — End: 2017-02-15
  Administered 2017-02-15: 12.5 mg via INTRAVENOUS
  Filled 2017-02-15: qty 1

## 2017-02-15 MED ORDER — ONDANSETRON 4 MG PO TBDP *I*
4.0000 mg | ORAL_TABLET | Freq: Once | ORAL | Status: DC
Start: 2017-02-15 — End: 2017-02-16

## 2017-02-15 MED ORDER — SODIUM CHLORIDE 0.9 % IV BOLUS *I*
500.0000 mL | Freq: Once | Status: AC
Start: 2017-02-15 — End: 2017-02-15
  Administered 2017-02-15: 500 mL via INTRAVENOUS

## 2017-02-15 NOTE — ED Notes (Signed)
Pt up and ambulatory without assistance. Pt tolerating PO intake. Pt discharge instructions reviewed. Pt comfortable with discharge planning and verbalizes understanding of discharge instruction. Pt will follow up with PCP as needed. Provided information on inpatient substance abuse locations in the area. Pt belongings with pt. Pt safe to discharge at this time, her sister will be driving her home.

## 2017-02-15 NOTE — ED Notes (Addendum)
Pt is in outpatient for three months for alcohol and cocaine. Drank a liter of liquor last night and now looking for resources for inpatient rehab. Endorses nausea. Pt with visible tremors. Endorses N/V with poor PO intake. Pt endorses she has been drinking for the past few months on and off. Last cocaine use was April.

## 2017-02-15 NOTE — Discharge Instructions (Signed)
Chemical Dependency Resources     How can I tell if I have an alcohol problem?    Alcohol may be harmful for you if it causes a problem in any part of your life. Following are signs that you may have a drinking problem or are alcohol dependent.    · Blacking out or forgetting where you were or what you were doing  · Drinking to get drunk. Or, feeling like you need to drink more to get the same feeling or "buzz"  · Drinking to decrease pain or stress  · Drinking more than you had expected to drink  · Drinking in a pattern. For example, every day or every week at the same time  · Suffering from the effects of alcohol on your daily function  · Drinking starts to take over and causes problems in your daily life, such as not showing up for work or driving when you are drunk  · Trying to hide how much you drink · Feeling guilty or angry when someone says something about your drinking  · Seeing or hearing things that are not there (hallucinating)  · Having major personality changes when you drink  · Planning activities around drinking  · Experiencing seizures (convulsions)  · Shaking of your hands if you have not had a drink for a while  · Sleeping problems or bad dreams  · Sweating, nervousness, confusion, or depression  · Thinking a lot about drinking  · Trouble having erections     What’s the harm?    Not all drinking is harmful. You may have heard that regular light to moderate drinking  (from ½ drink a day up to 1 drink a day for women and 2 for men) can even be good  for the heart. With at-risk or heavy drinking, however, any potential benefits are  outweighed by greater risks.    Injuries. Drinking too much increases your chances of being injured or even killed.  Alcohol is a factor, for example, in about 60% of fatal burn injuries, drownings, and  homicides; 50% of severe trauma injuries and sexual assaults; and 40% of fatal  motor vehicle crashes, suicides, and fatal falls.    Health problems. Heavy drinkers have  a greater risk of liver disease, heart disease,  sleep disorders, depression, stroke, bleeding from the stomach, sexually transmitted  infections from unsafe sex, and several types of cancer. They may also have problems  managing diabetes, high blood pressure, and other conditions.    Birth defects. Drinking during pregnancy can cause brain damage and other serious  problems in the baby. Because it is not yet known whether any amount of alcohol is  safe for a developing baby, women who are pregnant or may become pregnant should  not drink.    Alcohol use disorders. Generally known as alcoholism and alcohol abuse, alcohol  use disorders are medical conditions that doctors can diagnose when a patient’s  drinking causes distress or harm. In the [place], about 18 million people have  an alcohol use disorder.       Wellness Hints:    · Be honest and open about your alcohol use with your family, close friends, and health care professionals. Ask for and accept their help.  · Avoid persons who use and/or abuse alcohol and who try to get you to drink alcohol.  · Get the help of a counselor and keep regular appointments.  · Eat a normal, well-balanced diet, drink six to   eight glasses of water a day, and get plenty of rest.  · Replace social activities associated with drinking alcohol with those that do not involve alcohol.  · Consider cutting down on the amount of alcohol you drink and how often you drink it.    Resources for Drug and/or Alcohol Treatment   http://dasis3.samhsa.gov/                  DETOX FACILITIES:    [facility] ([place])  [address]th floor) [place] [address] [phone]     [facility] ([place]   [address] [place] [address] [phone]     [facility] ([place] or Straight Medicaid only)  [address] [place] [address] [phone]     [facility] ([facility], Alcohol Only)  [address] [place] [address] [phone]    [facility] ([facility] - Outpatient Only, Alcohol Only)  [address] [place] [address] [phone]   [facility]  [address] [place] [address] [phone]       INPATIENT PROGRAMS:    [place]   [address] [place] [address] ([place] [facility]) [phone]   [place]  [address] [place] [address] [phone] option 3     [facility]  [address] [place] [address]  (Facility is affiliated with [facility]. Referral must be sent to [facility] who will then forward information to this location.) [phone]     [facility] ([facility] [place])  [address] [place] [address] [phone]     [facility] ([place]) - [facility]-   [address] [place] [address]  Facility is affiliated with [facility]. Referral must be sent to [facility] who will then forward information to this location.) [phone]     [facility]   [address] [place] [address] (located on [facility]) [phone]      [facility] ([facility])  [address] [place] [address] [phone]      [facility] ([place])   [address] [place] [address] [phone]           OUTPATIENT PROGRAMS:    [facility] - Comprehensive [facility]ism  [address] [place] [address] [phone]   [facility] ([place])  [address] [place] [address] [phone]     [place] [facility]  [address] [place] [address] ([place] [facility])  [address] [place] [address] [phone]  [phone]     [facility] (Monday- Thursday)  [address] [place] [address] [phone]     [facility]  [address] [place] [address]  [address] [place] [address] (Spanish speaking available)  [address] [place] [phone]  (indicate location when calling  main intake line)   [facility] ([facility]) - [facility]  [address] [place] [address] [phone]     [facility] ([place])  [address] [place]  [address] [phone]     [facility] (@ [facility])  [address]   [address] [phone]     [facility]  [address]   [address] [phone]     [facility]   [address] [place] [address]   [address] [place] [address]    [address] [place] [address] ([place])  [phone]  (indicate location when calling main intake line)   [facility]  [address] [place] [address] [phone]         ADDITIONAL RESOURCES:    Al-Anon/Al-Teen [phone]   Alcohol Abuse & Addiction Information 1-800-234-0420   Alcohol Anonymous 232-6720   Drug Abuse Information Line 1-800-522-5353   Drug and Alcohol Council 467-2230   LifeLine 275-5151 or 211   [place] [facility] on [facility] & Substance Abuse ([place]) 243-9210   Narcotics Anonymous Hotline 234-7889   [facility] [phone] ext [phone]       VETERANS ADMINISTRATION:    [place]  [address] [place] [address] [phone]      [place] No CD Treatment   [place]  [address] [place] [address] [phone]      [place]  [address] [place] [address] [phone]      [place]  [address] [place] [address] [phone]      [facility]  [address] [place] [address]  *only offers intake appointment if veteran is unable to be seen at another facility. Will refer out for treatment after intake is completed.  [phone]     [place] No CD treatment    [facility]   [address]  [place] [address] [phone]

## 2017-02-15 NOTE — ED Provider Notes (Addendum)
History     Chief Complaint   Patient presents with    Withdrawal    Depression       History provided by:  Patient and relative  Language interpreter used: No      31 yo F with history of alcohol and cocaine abuse, factor V Leiden, benign liver tumors,  who would like inpatient chemical dependency admission for alcohol dependence.  She moved from [place] 3 months ago after what she and her sister describe as a psychotic break secondary to drug use.  She was in an outpatient chemical dependency for past three months, but endorses still using alcohol. Last cocaine use was April, last drink yesterday at 3am, 1 liter of liquor.  She vomited at 4am, bilious.  She was nauseous on arrival, but now reports symptom improvement with 1 L of fluids and phenergan.  She just ate a sandwich and drank ginger ale. She urinated once this evening.  Pt positive for many ROS including dizziness, confusion, weakness, heart palpitations, SOB, headache, and abdominal pain.  She reports she feels tremulous, but notes she is not exhibiting physical tremors.  She attributes symptoms to her alcohol use and dehydration.    She denies infectious symptoms.  She takes no medications.  She has not been hospitalized before.          Medical/Surgical/Family History     Past Medical History:   Diagnosis Date    Acne         Patient Active Problem List   Diagnosis Code    Compression Arthralgia Of The Shoulder Region M25.519    Acne L70.9    Nevus D22.9    Sun-damaged skin L57.8            No past surgical history on file.  No family history on file.       Social History   Substance Use Topics    Smoking status: Never Smoker    Smokeless tobacco: Not on file    Alcohol use 0.6 oz/week     1 Glasses of wine per week     Living Situation     Questions Responses    Patient lives with     Homeless     Caregiver for other family member     External Services     Employment     Domestic Violence Risk                 Review of Systems    Review of Systems   Constitutional: Negative for fever.   Gastrointestinal: Positive for nausea. Negative for constipation and diarrhea.   Genitourinary: Negative for dysuria.   Skin: Negative for rash.   Neurological: Positive for dizziness and headaches.   Psychiatric/Behavioral: Positive for confusion and decreased concentration. Negative for suicidal ideas. The patient is nervous/anxious.        Physical Exam     Triage Vitals  Triage Start: Start, (02/15/17 1553)   First Recorded BP: 117/78, Resp: 20, Temp: 36.3 C (97.3 F) Oxygen Therapy SpO2: 98 %, O2 Device: None (Room air), Heart Rate: (!) 113, (02/15/17 1554)  .      Physical Exam   Constitutional: She is oriented to person, place, and time. She appears well-developed and well-nourished. No distress.   HENT:   Head: Normocephalic and atraumatic.   Eyes: EOM are normal. Pupils are equal, round, and reactive to light.   Neck: Normal range of motion. Neck supple.  Cardiovascular: Normal rate and regular rhythm.    Pulmonary/Chest: Effort normal and breath sounds normal.   Abdominal: Soft. Bowel sounds are normal.   Musculoskeletal: Normal range of motion.   Neurological: She is alert and oriented to person, place, and time.   No visible tremors   Skin: Skin is warm. No erythema.   Scars on arms from prior cuts and scratches   Psychiatric: Her speech is normal and behavior is normal. Thought content normal.   Nursing note and vitals reviewed.      Medical Decision Making        Initial Evaluation:  ED First Provider Contact     Date/Time Event User Comments    02/15/17 1552 ED First Provider Contact MAPES, TRACY L Initial Face to Face Provider Contact          Patient seen by me on 02/15/2017.    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 EKG is normal, vitals are stable.  Pt is medically cleared, there is not inpatient detox at [facility], but a list of other facilities will be  provided.      Differential Diagnosis includes:  Alcohol and cocaine use disorder      Plan:  #Alcohol use disorder  -IVF  -nausea with Phenergan, Zofran if needed  -monitor for tremors    Joselyn Glassman, MD      Resident Attestation:    Patient seen by me on 02/15/2017.    History:  I reviewed this patient, reviewed the resident's note and agree.    Exam:  I examined this patient, reviewed the resident's note and agree.    Decision Making:  I discussed with the resident his/her documented decision making and agree.      Additional attestation comments:  31 y/o F arrives requesting inpatient detox. Currently in outpatient detox program, but she is relapsing in the evenings frequently. She is tearful and frustrated, but not suicidal. Not currently intoxicated. Explained that unfortunately we do not offer inpatient detox. I gave her community resources. I encouraged her to return for any feelings of withdrawal, suicidal thoughts, or other new concerns. .    Author:  Jonna Munro, MD       Joselyn Glassman, MD  Resident  02/15/17 4540       Jonna Munro, MD  02/24/17 [phone]

## 2017-02-15 NOTE — ED Notes (Signed)
Pt to the ED seeking inpatient substance abuse care. Pt reports a hx of ETOH & cocaine addiction. Has been going to outpatient substance abuse, but has continue to drink ETOH. Denies any recent cocaine use. Reports her last drink was at 0300. Is concerned that she is withdrawing - endorsing nausea, anxiety, and tremors. She appears tearful, and states she "feels ashamed". Denies SI. Has no other other complaints. Her sister is at bedside.

## 2017-02-15 NOTE — ED Notes (Signed)
Pt endorses no urine output today. Bladder scanned for 400cc.

## 2017-02-15 NOTE — First Provider Contact (Signed)
ED First Provider Contact Note    Initial provider evaluation performed by   ED First Provider Contact     Date/Time Event User Comments    02/15/17 1552 ED First Provider Contact Trentyn Boisclair L Initial Face to Face Provider Contact        Pt is a 31 y.o. white female last drink 3am, pt is in rehab currently, pt is feeling depressed, pt is also vomiting, nausea   Vital signs reviewed.    Orders placed:  EKG and LABS     Patient requires further evaluation.     [place], PA, 02/15/2017, 3:52 PM    Supervising physician Dr. Louanne Skye  was immediately available     Lindie Spruce, [place]  02/15/17 1554

## 2017-02-15 NOTE — ED Triage Notes (Signed)
Depressed about her drinking / last drink 03:00 am / Med Clearance for Psych       Triage Note   Cheryl Rankin, RN

## 2017-02-16 ENCOUNTER — Encounter: Payer: Self-pay | Admitting: Emergency Medicine

## 2017-02-16 ENCOUNTER — Emergency Department
Admission: EM | Admit: 2017-02-16 | Discharge: 2017-02-16 | Disposition: A | Discharge status: Home / Self Care | Payer: Medicaid Other | Source: Ambulatory Visit | Attending: Emergency Medicine | Admitting: Emergency Medicine

## 2017-02-16 DIAGNOSIS — Z7289 Other problems related to lifestyle: Secondary | ICD-10-CM

## 2017-02-16 DIAGNOSIS — R110 Nausea: Secondary | ICD-10-CM

## 2017-02-16 DIAGNOSIS — F10230 Alcohol dependence with withdrawal, uncomplicated: Secondary | ICD-10-CM

## 2017-02-16 DIAGNOSIS — F419 Anxiety disorder, unspecified: Secondary | ICD-10-CM

## 2017-02-16 DIAGNOSIS — R251 Tremor, unspecified: Secondary | ICD-10-CM

## 2017-02-16 DIAGNOSIS — F1090 Alcohol use, unspecified, uncomplicated: Secondary | ICD-10-CM

## 2017-02-16 DIAGNOSIS — F1010 Alcohol abuse, uncomplicated: Secondary | ICD-10-CM | POA: Insufficient documentation

## 2017-02-16 LAB — CBC AND DIFFERENTIAL
Baso # K/uL: 0 10*3/uL (ref 0.0–0.1)
Basophil %: 0.5 %
Eos # K/uL: 0 10*3/uL (ref 0.0–0.4)
Eosinophil %: 0.4 %
Hematocrit: 41 % (ref 34–45)
Hemoglobin: 14.1 g/dL (ref 11.2–15.7)
IMM Granulocytes #: 0 10*3/uL (ref 0.0–0.1)
IMM Granulocytes: 0.4 %
Lymph # K/uL: 1 10*3/uL — ABNORMAL LOW (ref 1.2–3.7)
Lymphocyte %: 18.4 %
MCH: 33 pg — ABNORMAL HIGH (ref 26–32)
MCHC: 34 g/dL (ref 32–36)
MCV: 97 fL — ABNORMAL HIGH (ref 79–95)
Mono # K/uL: 0.4 10*3/uL (ref 0.2–0.9)
Monocyte %: 7.5 %
Neut # K/uL: 4 10*3/uL (ref 1.6–6.1)
Nucl RBC # K/uL: 0 10*3/uL (ref 0.0–0.0)
Nucl RBC %: 0 /100 WBC (ref 0.0–0.2)
Platelets: 287 10*3/uL (ref 160–370)
RBC: 4.2 MIL/uL (ref 3.9–5.2)
RDW: 11.8 % (ref 11.7–14.4)
Seg Neut %: 72.8 %
WBC: 5.5 10*3/uL (ref 4.0–10.0)

## 2017-02-16 LAB — ACETAMINOPHEN LEVEL: Acetaminophen: <5 ug/mL

## 2017-02-16 LAB — DRUG SCREEN CHEMICAL DEPENDENCY, URINE
Amphetamine,UR: NEGATIVE
Benzodiazepinen,UR: NEGATIVE
Cocaine/Metab,UR: NEGATIVE
Opiates,UR: NEGATIVE
THC Metabolite,UR: NEGATIVE

## 2017-02-16 LAB — DRUGS OF ABUSE SCRN,URINE
Amphetamine,UR: NEGATIVE
Barbiturate,UR: NEGATIVE
Benzodiazepinen,UR: NEGATIVE
Cocaine/Metab,UR: NEGATIVE
Opiates,UR: NEGATIVE
PCP,UR: NEGATIVE
THC Metabolite,UR: NEGATIVE
Tricyclics,UR: NEGATIVE

## 2017-02-16 LAB — BASIC METABOLIC PANEL
Anion Gap: 13 (ref 7–16)
CO2: 27 mmol/L (ref 20–28)
Calcium: 9.5 mg/dL (ref 8.8–10.2)
Chloride: 101 mmol/L (ref 96–108)
Creatinine: 0.79 mg/dL (ref 0.51–0.95)
GFR,Black: 115 *
GFR,Caucasian: 100 *
Glucose: 96 mg/dL (ref 60–99)
Lab: 13 mg/dL (ref 6–20)
Potassium: 4.1 mmol/L (ref 3.3–5.1)
Sodium: 141 mmol/L (ref 133–145)

## 2017-02-16 LAB — SALICYLATE LEVEL: Salicylate: <2 mg/dL — ABNORMAL LOW (ref 15.0–30.0)

## 2017-02-16 LAB — ETHANOL: Ethanol: <10 mg/dL

## 2017-02-16 NOTE — ED Notes (Signed)
ED RN INTERN ATTESTATION       I Phillip Heal, RN (RN) reviewed the following charting information by the RN intern: rebecca hasmann    Nursing Assessments  Medications  Plan of Care  Teaching   Notes    In the chart of Cheryl Weiss (31 y.o. female) and attest to the charting being accurate.

## 2017-02-16 NOTE — ED Notes (Signed)
Pt presents to ED with hopes of getting into [facility] for alcohol addiction.  Pt reports her last drink was Tuesday at 3am, she drank 1 pint of whiskey.  States that she is feeling shakey and anxious, but that she has never had seizures with withdrawal before.

## 2017-02-16 NOTE — Progress Notes (Signed)
Pt has not developed further withdrawal sxs and reports feeling okay. She will be discharged home to follow up with her outpatient counselor to complete inpatient referral.     Danford Bad  [facility]  Office [phone]  Pager [phone]

## 2017-02-16 NOTE — ED Triage Notes (Signed)
Pt here requesting detox from ETOH, last drink was  tues at 3am. Pt was seen at [facility] yesterday, referred here for [facility].  Mild shaking and anxiety present    Triage Note   Tammi Klippel, RN

## 2017-02-16 NOTE — ED Provider Notes (Signed)
History     Chief Complaint   Patient presents with    Drug / Alcohol Assessment     HPI Comments: Pt is a 31 year old female with a past medical history of alcohol and cocaine abuse, Factor V Leiden, and benign liver tumors who comes to the ED with her supportive sister with wishes to come into inpatient chemical dependency through [facility]. Pt was seen at [facility] yesterday for alcohol withdrawal and help quitting her alcohol addiction. Pt was living in [place] 3 months ago but she had a mental break and began using cocaine and was self-harming herself. Her sister and her family were supportive and requested that she come back. She has been going to outpatient group therapy for the past 3 months. Pt has never been put on medical treatment for her substance use disorders. Pt last used cocaine April 30th of this year. Pt's last drink was yesterday at 3am, 1L of Fireball liquor and 2, 24 oz Bud lights. Pt's normal daily alcohol use is 12 beers and 1 pint of whiskey. Pt had an episode of vomiting following the drinking yesterday and was unable to even keep water down. Pt was given IV fluids and Phenergan yesterday at [facility] with relief. Pt currently is a little shaky, mildly nauseous, and anxious. Pt feels that she is "bruised all over internally because I have poisoned myself. "Pt has never been through withdrawal before as she has been drinking since she turned 21. States that the drinking started out socially and then worsened from there. Pt has been drinking most heavily for the past 6 months. Pt denies fevers, chills, chest pain, shortness of breath, seizures, abdominal pain, numbness, tingling.           History provided by:  Patient  Language interpreter used: No        Medical/Surgical/Family History     Past Medical History:   Diagnosis Date    Acne         Patient Active Problem List   Diagnosis Code    Compression Arthralgia Of The Shoulder Region M25.519    Acne L70.9    Nevus D22.9     Sun-damaged skin L57.8    Medical clearance for psychiatric admission Z00.8    Alcohol abuse F10.10            No past surgical history on file.  No family history on file.       Social History   Substance Use Topics    Smoking status: Never Smoker    Smokeless tobacco: Not on file    Alcohol use 0.6 oz/week     1 Glasses of wine per week     Living Situation     Questions Responses    Patient lives with     Homeless     Caregiver for other family member     External Services     Employment     Domestic Violence Risk                 Review of Systems   Review of Systems   Constitutional: Negative for chills, fatigue and fever.   Respiratory: Negative for chest tightness and shortness of breath.    Cardiovascular: Negative for chest pain.   Gastrointestinal: Positive for nausea. Negative for abdominal pain, blood in stool, constipation, diarrhea and vomiting.   Musculoskeletal: Negative for back pain, joint swelling and neck pain.   Skin: Negative for color change.  Neurological: Positive for tremors. Negative for dizziness, seizures, syncope, weakness, light-headedness and headaches.   Psychiatric/Behavioral: The patient is nervous/anxious.        Physical Exam     Triage Vitals  Triage Start: Start, (02/16/17 1347)   First Recorded BP: 130/83, Resp: 16, Temp: 37 C (98.6 F), Temp src: TEMPORAL Oxygen Therapy SpO2: 100 %, Oximetry Source: Rt Hand, O2 Device: None (Room air), Heart Rate: 103, (02/16/17 1348)  .  First Pain Reported  0-10 Scale: 0, Pain Location/Orientation: Generalized, (02/16/17 1348)       Physical Exam   Constitutional: She is oriented to person, place, and time. She appears well-developed. No distress.   HENT:   Head: Normocephalic and atraumatic.   Eyes: Conjunctivae are normal.   Neck: Normal range of motion.   Cardiovascular: Regular rhythm and normal heart sounds.  Tachycardia present.    Pulmonary/Chest: Effort normal and breath sounds normal. No respiratory distress. She has no  wheezes.   Musculoskeletal: Normal range of motion.   Neurological: She is alert and oriented to person, place, and time. No cranial nerve deficit. Coordination normal.   No visible tremulous activity.    Skin: Skin is warm and dry. She is not diaphoretic.   Psychiatric: She has a normal mood and affect. Her behavior is normal. Judgment and thought content normal.   Nursing note and vitals reviewed.      Medical Decision Making        Initial Evaluation:  ED First Provider Contact     Date/Time Event User Comments    02/16/17 1414 ED First Provider Contact Jabre Heo Initial Face to Face Provider Contact          Patient seen by me on arrival date of 02/16/2017.    Assessment:  31 y.o.female comes to the ED with alcohol withdrawal and wishes to become inpatient with [facility].       Differential Diagnosis includes:  Acute alcohol withdrawal, addiction, alcohol intoxication       Plan:   CBC with differential, BMP, Ethanol level, Salicylate, Acetaminophen level, Urine drug screen,   [facility] Consult    Labs showed:  Labs Reviewed   CBC AND DIFFERENTIAL - Abnormal; Notable for the following:        Result Value    MCV 97 (*)     MCH 33 (*)     Lymph # K/uL 1.0 (*)     All other components within normal limits   SALICYLATE LEVEL - Abnormal; Notable for the following:     Salicylate <1.6 (*)     All other components within normal limits   BASIC METABOLIC PANEL   ETHANOL   ACETAMINOPHEN LEVEL   DRUGS OF ABUSE SCRN,URINE     Plan to discharge pt home  No evidence of acute alcohol withdrawal- Jerline Pain is agreeable with this plan       Denna Haggard, NP    Collaborating physician Dr. Jerl Santos was immediately available        Denna Haggard, NP  02/16/17 1611

## 2017-02-16 NOTE — Discharge Instructions (Signed)
Please drink lots of water.    Avoid drinking to excess in the future. It is very bad for your health.      If you are interested in Detox please consider contacting one of the following Detox centers located in [place]:                          [facility]: [phone]  [facility]: [phone]  [facility]: [phone]  [facility] [phone]    You can contact Alcoholics Anonymous for other resources and assistance.    Website: [email or web address]  Phone: [phone]    If you develop any symptoms of acute withdrawal including:    Tremors  Fast heart rate  Hallucinations  Seizures       Please return to the Emergency Department.

## 2017-02-16 NOTE — Progress Notes (Signed)
Pt seen for chemical dependency consult. Pt reports she drank a quart of whiskey plus beers 2 nights ago but typically drinks a pint of liquor daily. She currently is experiencing mild tremors and nausea but doesn't appear to be in acute alcohol withdrawal. Pt has hx of cocaine abuse but no use since April. Pt is currently in an outpatient chem dependency program. Will reassess pt after labs completed.    Danford Bad  [facility]  Office [phone]  Pager [phone]

## 2017-02-28 LAB — EKG 12-LEAD
P: 64 deg
PR: 136 ms
QRS: 81 deg
QRSD: 82 ms
QT: 384 ms
QTc: 468 ms
Rate: 89 {beats}/min
Severity: BORDERLINE
T: 80 deg

## 2017-05-31 ENCOUNTER — Other Ambulatory Visit
Admission: RE | Admit: 2017-05-31 | Discharge: 2017-05-31 | Disposition: A | Discharge status: Home / Self Care | Payer: Medicaid Other | Source: Ambulatory Visit | Attending: Family Medicine | Admitting: Family Medicine

## 2017-05-31 DIAGNOSIS — F1020 Alcohol dependence, uncomplicated: Secondary | ICD-10-CM | POA: Insufficient documentation

## 2017-05-31 LAB — HEPATIC FUNCTION PANEL
ALT: 17 U/L (ref 0–35)
AST: 26 U/L (ref 0–35)
Albumin: 4.7 g/dL (ref 3.5–5.2)
Alk Phos: 38 U/L (ref 35–105)
Bilirubin,Direct: <0.2 mg/dL (ref 0.0–0.3)
Bilirubin,Total: 0.4 mg/dL (ref 0.0–1.2)
Total Protein: 7.2 g/dL (ref 6.3–7.7)

## 2018-04-04 ENCOUNTER — Encounter: Payer: Self-pay | Admitting: Emergency Medicine

## 2018-04-04 ENCOUNTER — Emergency Department: Payer: Medicaid - Out of State

## 2018-04-04 ENCOUNTER — Other Ambulatory Visit: Payer: Self-pay

## 2018-04-04 ENCOUNTER — Emergency Department
Admission: EM | Admit: 2018-04-04 | Discharge: 2018-04-04 | Disposition: A | Discharge status: Home / Self Care | Payer: Medicaid - Out of State | Attending: Emergency Medicine | Admitting: Emergency Medicine

## 2018-04-04 DIAGNOSIS — O209 Hemorrhage in early pregnancy, unspecified: Secondary | ICD-10-CM

## 2018-04-04 DIAGNOSIS — O021 Missed abortion: Secondary | ICD-10-CM | POA: Insufficient documentation

## 2018-04-04 DIAGNOSIS — O208 Other hemorrhage in early pregnancy: Secondary | ICD-10-CM

## 2018-04-04 DIAGNOSIS — F17210 Nicotine dependence, cigarettes, uncomplicated: Secondary | ICD-10-CM | POA: Insufficient documentation

## 2018-04-04 DIAGNOSIS — Z79899 Other long term (current) drug therapy: Secondary | ICD-10-CM | POA: Insufficient documentation

## 2018-04-04 DIAGNOSIS — O99331 Smoking (tobacco) complicating pregnancy, first trimester: Secondary | ICD-10-CM | POA: Insufficient documentation

## 2018-04-04 DIAGNOSIS — Z3A11 11 weeks gestation of pregnancy: Secondary | ICD-10-CM | POA: Insufficient documentation

## 2018-04-04 LAB — BASIC METABOLIC PANEL
ANION GAP: 8 (ref 5–15)
BUN: 7 mg/dL (ref 6–20)
CO2: 27 mmol/L (ref 22–32)
Calcium: 8.9 mg/dL (ref 8.9–10.3)
Chloride: 102 mmol/L (ref 98–111)
Creatinine, Ser: 0.65 mg/dL (ref 0.44–1.00)
GFR calc Af Amer: >60 mL/min (ref >60)
Glucose, Bld: 99 mg/dL (ref 70–99)
POTASSIUM: 3.6 mmol/L (ref 3.5–5.1)
SODIUM: 137 mmol/L (ref 135–145)

## 2018-04-04 LAB — CBC
HEMATOCRIT: 41.4 % (ref 35.0–47.0)
HEMOGLOBIN: 14.4 g/dL (ref 12.0–16.0)
MCH: 34.3 pg — ABNORMAL HIGH (ref 26.0–34.0)
MCHC: 34.9 g/dL (ref 32.0–36.0)
MCV: 98.3 fL (ref 80.0–100.0)
Platelets: 376 10*3/uL (ref 150–440)
RBC: 4.21 MIL/uL (ref 3.80–5.20)
RDW: 12 % (ref 11.5–14.5)
WBC: 9 10*3/uL (ref 3.6–11.0)

## 2018-04-04 LAB — POCT PREGNANCY, URINE: Preg Test, Ur: POSITIVE — AB

## 2018-04-04 LAB — ABO/RH: ABO/RH(D): A POS

## 2018-04-04 LAB — HCG, QUANTITATIVE, PREGNANCY: HCG, BETA CHAIN, QUANT, S: 4109 m[IU]/mL — AB (ref <5)

## 2018-04-04 MED ORDER — IBUPROFEN 800 MG PO TABS
800.0000 mg | ORAL_TABLET | Freq: Once | ORAL | Status: AC
  Administered 2018-04-04: 800 mg via ORAL
  Filled 2018-04-04: qty 1

## 2018-04-04 NOTE — Discharge Instructions (Signed)
Please call the [facility] gynecology clinic today to make an appointment for tomorrow or the next day.  Return to the emergency department for any concerns whatsoever.  It was a pleasure to take care of you today, and thank you for coming to our emergency department.  If you have any questions or concerns before leaving please ask the nurse to grab me and I'm more than happy to go through your aftercare instructions again.  If you were prescribed any opioid pain medication today such as Norco, Vicodin, Percocet, morphine, hydrocodone, or oxycodone please make sure you do not drive when you are taking this medication as it can alter your ability to drive safely.  If you have any concerns once you are home that you are not improving or are in fact getting worse before you can make it to your follow-up appointment, please do not hesitate to call [phone] and come back for further evaluation.  Merrily Brittle, MD  Results for orders placed or performed during the hospital encounter of 04/04/18  hCG, quantitative, pregnancy  Result Value Ref Range   hCG, Beta Chain, Quant, S 4,109 (H) <5 mIU/mL  Basic metabolic panel  Result Value Ref Range   Sodium 137 135 - 145 mmol/L   Potassium 3.6 3.5 - 5.1 mmol/L   Chloride 102 98 - 111 mmol/L   CO2 27 22 - 32 mmol/L   Glucose, Bld 99 70 - 99 mg/dL   BUN 7 6 - 20 mg/dL   Creatinine, Ser 0.45 0.44 - 1.00 mg/dL   Calcium 8.9 8.9 - 40.9 mg/dL   GFR calc non Af Amer >60 >60 mL/min   GFR calc Af Amer >60 >60 mL/min   Anion gap 8 5 - 15  CBC  Result Value Ref Range   WBC 9.0 3.6 - 11.0 K/uL   RBC 4.21 3.80 - 5.20 MIL/uL   Hemoglobin 14.4 12.0 - 16.0 g/dL   HCT 81.1 91.4 - 78.2 %   MCV 98.3 80.0 - 100.0 fL   MCH 34.3 (H) 26.0 - 34.0 pg   MCHC 34.9 32.0 - 36.0 g/dL   RDW 95.6 21.3 - 08.6 %   Platelets 376 150 - 440 K/uL  Pregnancy, urine POC  Result Value Ref Range   Preg Test, Ur POSITIVE (A) NEGATIVE  ABO/Rh  Result Value Ref Range   ABO/RH(D)      A  POS Performed at [facility], [address], [place], [place] [address]    US Ob Less Than 14 Weeks With Ob Transvaginal  Result Date: 04/04/2018 CLINICAL DATA:  Vaginal bleeding in early pregnancy EXAM: OBSTETRIC <14 WK [place] AND TRANSVAGINAL OB US TECHNIQUE: Both transabdominal and transvaginal ultrasound examinations were performed for complete evaluation of the gestation as well as the maternal uterus, adnexal regions, and pelvic cul-de-sac. Transvaginal technique was performed to assess early pregnancy. COMPARISON:  None. FINDINGS: Intrauterine gestational sac: Large irregular gestational sac. Yolk sac:  Not seen Embryo:  Not seen Cardiac Activity: Not seen MSD: 22.2 mm   7 w   1 d Subchorionic hemorrhage: Suspected moderate to large subchorionic hemorrhage. Maternal uterus/adnexae: Left ovary is within normal limits and measures 2.5 x 1.3 x 1.7 cm. The right ovary measures 3.1 x 1.8 x 1.7 cm. Isoechoic area in the right ovary measuring 1.8 cm, possible luteal body. Small amount of fluid in the cul-de-sac. IMPRESSION: 1. Irregular fluid collection within the uterus measuring 22.2 mm, presumably representing irregular gestational sac. No yolk sac  or fetal pole is visible. Findings are suspicious but not yet definitive for failed pregnancy. Recommend follow-up US in 10-14 days for definitive diagnosis. This recommendation follows SRU consensus guidelines: Diagnostic Criteria for Nonviable Pregnancy Early in the First Trimester. Malva Limes Med 2013; 989:2119-41. 2. Appearance suspicious for moderate, possibly large subchorionic hemorrhage. 3. 1.8 cm isodose to hypoechoic area in the right ovary possibly representing a luteal body. Would correlate with trending of HCG and potential short interval sonographic follow-up to exclude other etiology of the right ovarian lesion given appearance of the gestational sac within the uterus. Electronically Signed   By: Jasmine Pang M.D.   On: 04/04/2018 16:53

## 2018-04-04 NOTE — ED Provider Notes (Signed)
[facility] Emergency Department Provider Note  ____________________________________________   First MD Initiated Contact with Patient 04/04/18 1545     (approximate)  I have reviewed the triage vital signs and the nursing notes.   HISTORY  Chief Complaint Vaginal Bleeding   HPI Shirley Perkins is a 32 y.o. female who comes to the emergency department with 2 weeks of heavy vaginal bleeding worse today.  She is roughly [redacted] weeks pregnant with a desired although unplanned pregnancy.  She figured she was having a miscarriage and it would pass on its own however today the bleeding became acutely worse associated with moderate severity cramping lower abdominal pain so she came to the emergency department for further evaluation.  She was not taking fertility medications.  Nothing seems to make the symptoms better or worse.  No fevers or chills.    History reviewed. No pertinent past medical history.  There are no active problems to display for this patient.   History reviewed. No pertinent surgical history.  Prior to Admission medications   Medication Sig Start Date End Date Taking? Authorizing Provider  albuterol (PROVENTIL HFA;VENTOLIN HFA) 108 (90 Base) MCG/ACT inhaler Inhale 2 puffs into the lungs every 4 (four) hours as needed for wheezing or shortness of breath. 10/22/15   Triplett, Cari B, FNP  azithromycin (ZITHROMAX) 250 MG tablet 2 tablets today, then 1 tablet for the next 4 days. 10/22/15   Triplett, Cari B, FNP  guaiFENesin-codeine (ROBITUSSIN AC) 100-10 MG/5ML syrup Take 5 mLs by mouth 3 (three) times daily as needed for cough. 10/22/15   Triplett, Cari B, FNP  predniSONE (DELTASONE) 10 MG tablet Take 5 tablets (50 mg total) by mouth daily. 10/22/15   Triplett, Rulon Eisenmenger B, FNP    Allergies Penicillins and Sulfa antibiotics  No family history on file.  Social History Social History   Tobacco Use  . Smoking status: Current Every Day Smoker    Types:  Cigarettes  . Smokeless tobacco: Never Used  Substance Use Topics  . Alcohol use: Yes    Comment: occassional  . Drug use: Yes    Review of Systems Constitutional: No fever/chills Eyes: No visual changes. ENT: No sore throat. Cardiovascular: Denies chest pain. Respiratory: Denies shortness of breath. Gastrointestinal: Positive for abdominal pain.  No nausea, no vomiting.  No diarrhea.  No constipation. Genitourinary: Positive for vaginal bleeding Musculoskeletal: Negative for back pain. Skin: Negative for rash. Neurological: Negative for headaches, focal weakness or numbness.   ____________________________________________   PHYSICAL EXAM:  VITAL SIGNS: ED Triage Vitals  Enc Vitals Group     BP 04/04/18 1504 109/78     Pulse Rate 04/04/18 1504 91     Resp --      Temp 04/04/18 1504 98.4 F (36.9 C)     Temp Source 04/04/18 1504 Oral     SpO2 04/04/18 1504 100 %     Weight 04/04/18 1505 155 lb (70.3 kg)     Height 04/04/18 1505 6' (1.829 m)     Head Circumference --      Peak Flow --      Pain Score 04/04/18 1511 6     Pain Loc --      Pain Edu? --      Excl. in GC? --     Constitutional: Alert and oriented x4 pleasant cooperative speaks in full clear sentences Eyes: PERRL EOMI. Head: Atraumatic. Nose: No congestion/rhinnorhea. Mouth/Throat: No trismus Neck: No stridor.   Cardiovascular: Normal  rate, regular rhythm. Grossly normal heart sounds.  Good peripheral circulation. Respiratory: Normal respiratory effort.  No retractions. Lungs CTAB and moving good air Gastrointestinal: Soft nondistended mild suprapubic tenderness with no rebound or guarding no peritonitis Pelvic exam performed with female nurse chaperone: Scant bleeding in the vault.  Os closed. Musculoskeletal: No lower extremity edema   Neurologic:  Normal speech and language. No gross focal neurologic deficits are appreciated. Skin:  Skin is warm, dry and intact. No rash noted. Psychiatric: Mood  and affect are normal. Speech and behavior are normal.    ____________________________________________   DIFFERENTIAL includes but not limited to  Missed abortion, septic abortion, inevitable abortion, completed abortion, ectopic pregnancy ____________________________________________   LABS (all labs ordered are listed, but only abnormal results are displayed)  Labs Reviewed  HCG, QUANTITATIVE, PREGNANCY - Abnormal; Notable for the following components:      Result Value   hCG, Beta Chain, Quant, S 4,109 (*)    All other components within normal limits  CBC - Abnormal; Notable for the following components:   MCH 34.3 (*)    All other components within normal limits  POCT PREGNANCY, URINE - Abnormal; Notable for the following components:   Preg Test, Ur POSITIVE (*)    All other components within normal limits  BASIC METABOLIC PANEL  ABO/RH    Lab work reviewed by me shows positive pregnancy test otherwise unremarkable.  The patient is Rh+ __________________________________________  EKG   ____________________________________________  RADIOLOGY  Pelvic ultrasound reviewed by me with likely irregular gestational sac in the uterus ____________________________________________   PROCEDURES  Procedure(s) performed: no  Procedures  Critical Care performed: no  ____________________________________________   INITIAL IMPRESSION / ASSESSMENT AND PLAN / ED COURSE  Pertinent labs & imaging results that were available during my care of the patient were reviewed by me and considered in my medical decision making (see chart for details).   As part of my medical decision making, I reviewed the following data within the electronic MEDICAL RECORD NUMBER History obtained from family if available, nursing notes, old chart and ekg, as well as notes from prior ED visits.       ----------------------------------------- 4:35 PM on  04/04/2018 -----------------------------------------  The patient's ultrasound shows a gestational sac with some debris.  Her cervical office is closed.  She is had symptoms for 2-weeks concerning for missed abortion.  She says she knows she is Rh+.  I will call out to [facility] gynecology to discuss. ____________________________________________  I spoke with Dr. Dalbert Garnet on call who will kindly see the patient in clinic tomorrow.  Strict return precautions have been given with the patient.  FINAL CLINICAL IMPRESSION(S) / ED DIAGNOSES  Final diagnoses:  Missed abortion      NEW MEDICATIONS STARTED DURING THIS VISIT:  Discharge Medication List as of 04/04/2018  5:07 PM       Note:  This document was prepared using Dragon voice recognition software and may include unintentional dictation errors.     Merrily Brittle, MD 04/06/18 [phone]

## 2018-04-04 NOTE — ED Triage Notes (Signed)
Pt in via POV, reports being [redacted] weeks pregnant, reports heavy bleeding x 2 weeks.  Pt reports generalized weakness and pelvic and lower back pain.  Pt denies any OB care.  Pt ambulatory, vitals WDL, NAD noted a this time.

## 2020-03-25 IMAGING — US US OB < 14 WEEKS - US OB TV
1 series · 13 of 28 positions shown · non-contrast
Comparison: None.

CLINICAL DATA: Vaginal bleeding in early pregnancy

EXAM:
OBSTETRIC <14 WK US AND TRANSVAGINAL OB US
TECHNIQUE: Both transabdominal and transvaginal ultrasound examinations were
performed for complete evaluation of the gestation as well as the
maternal uterus, adnexal regions, and pelvic cul-de-sac.
Transvaginal technique was performed to assess early pregnancy.

[Series 1: us ob < 14 weeks - us ob tv · 0.20mm/px · 13 of 145 slices shown]
[im 6/145]
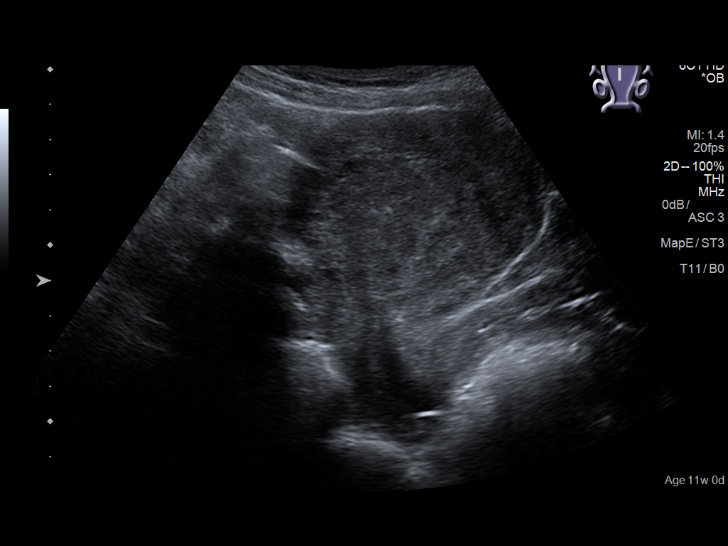
[im 17/145]
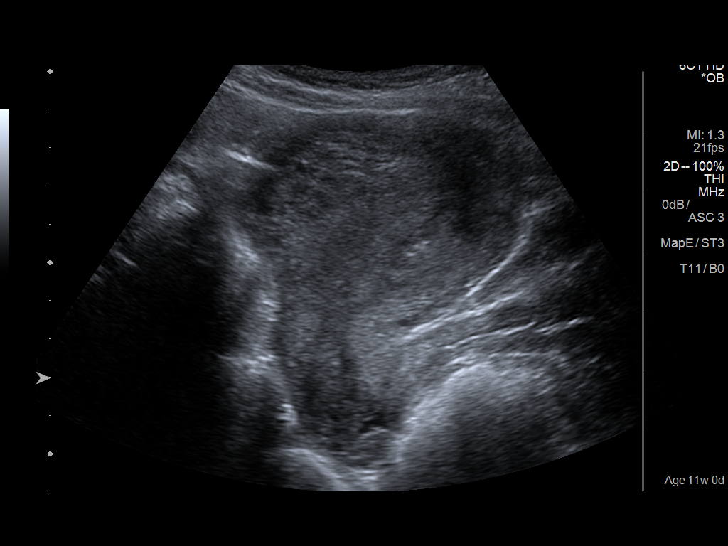
[im 27/145]
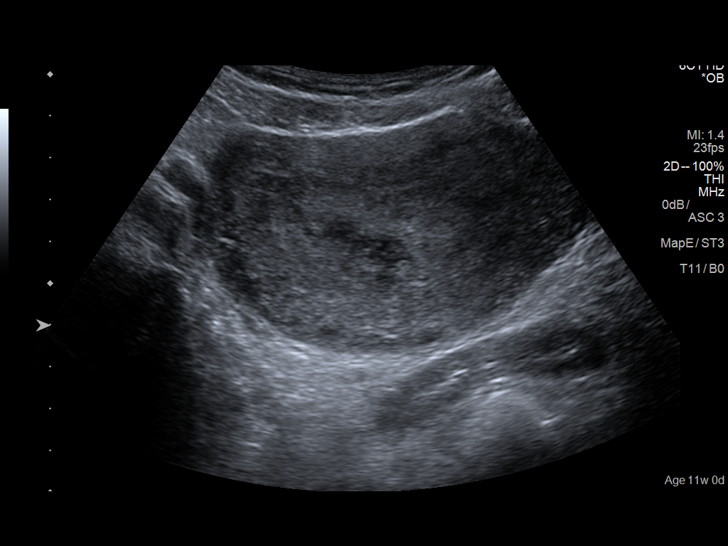
[im 38/145]
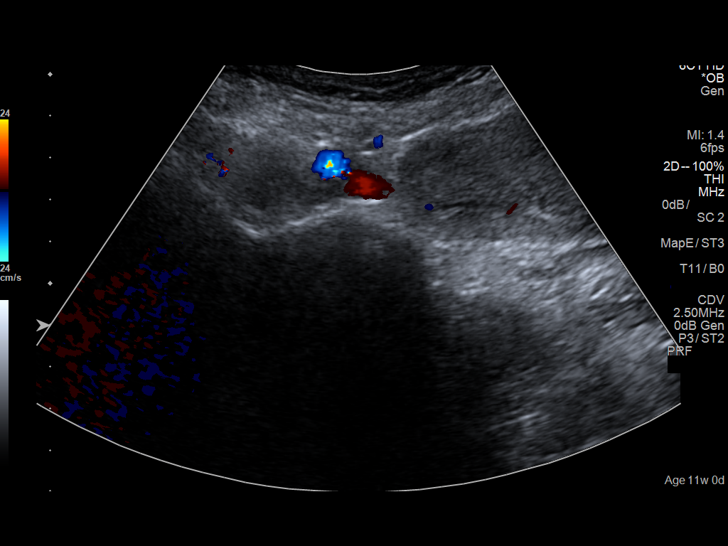
[im 49/145]
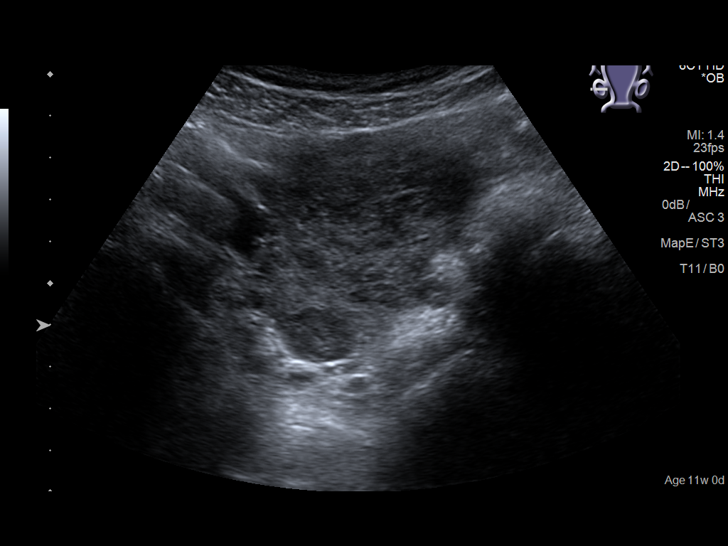
[im 59/145]
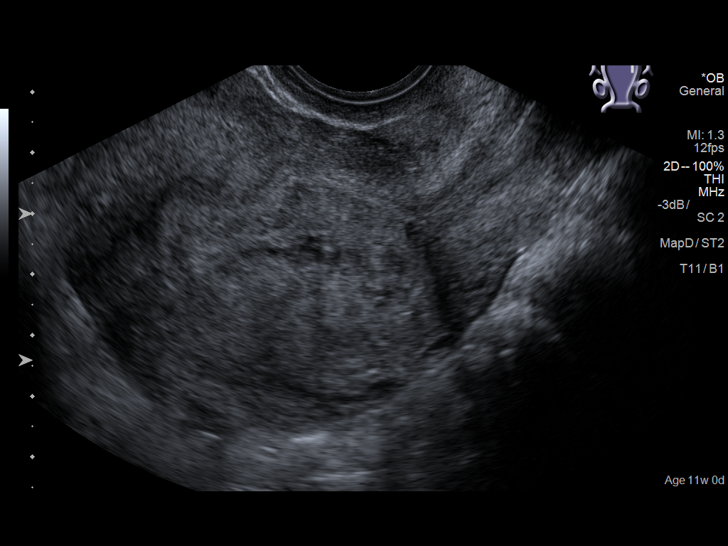
[im 75/145]
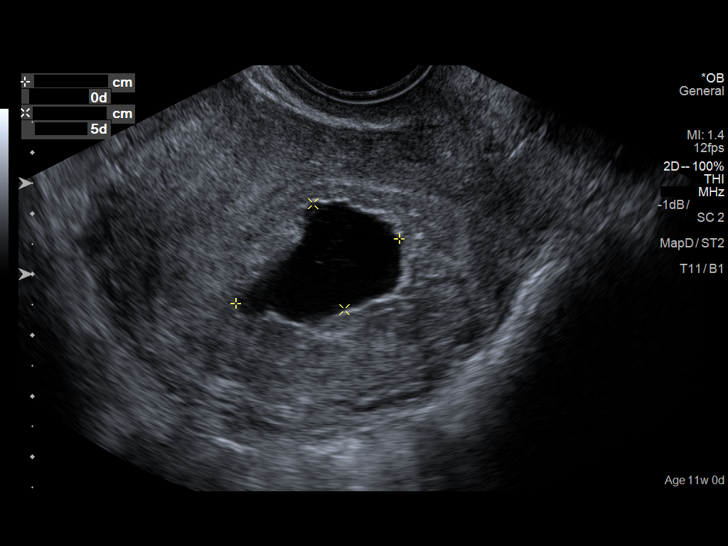
[im 86/145]
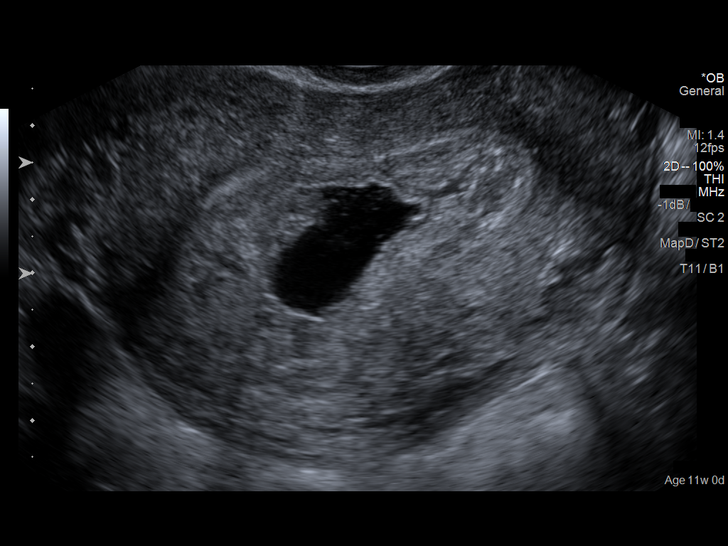
[im 97/145]
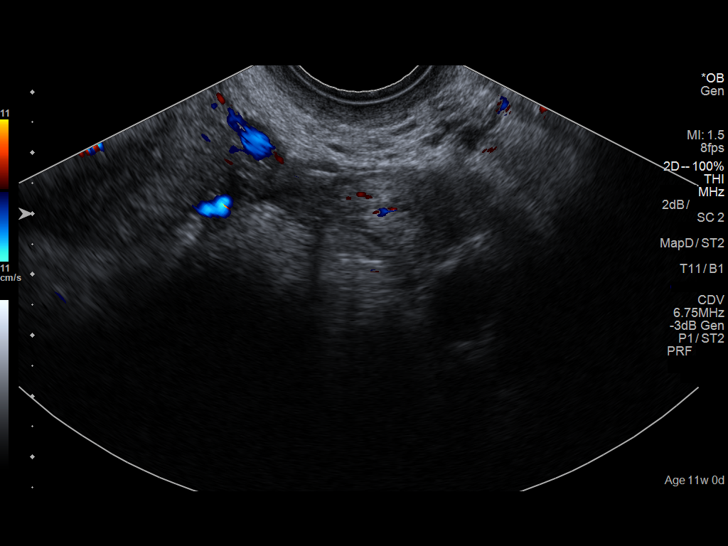
[im 107/145]
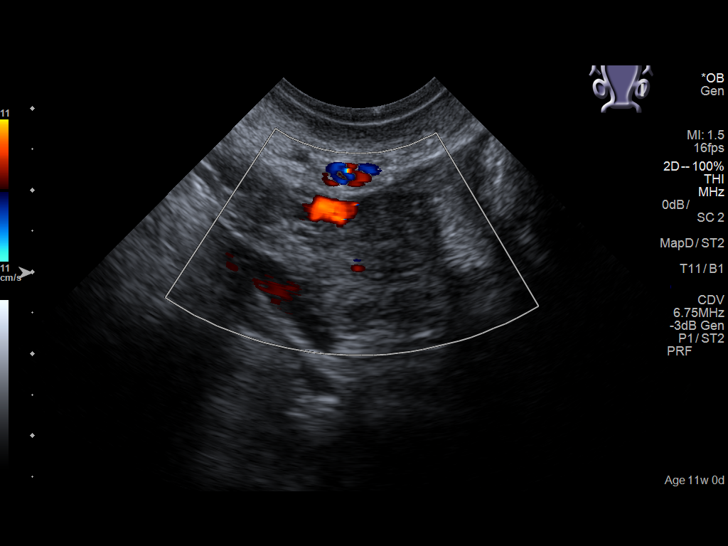
[im 118/145]
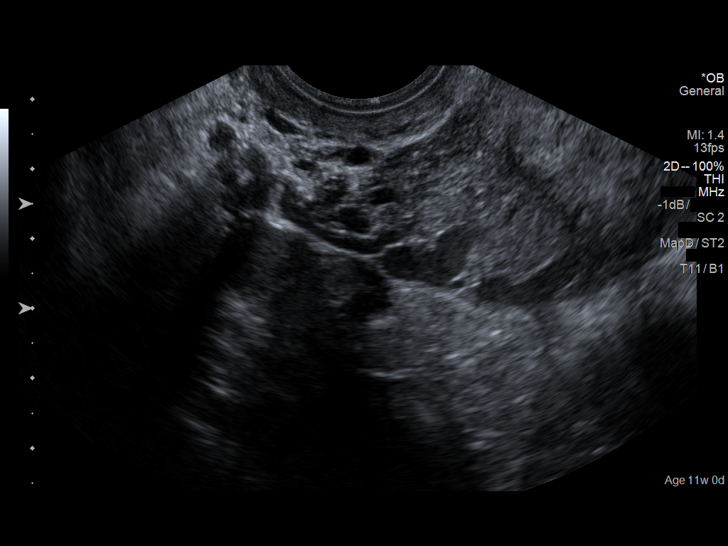
[im 129/145]
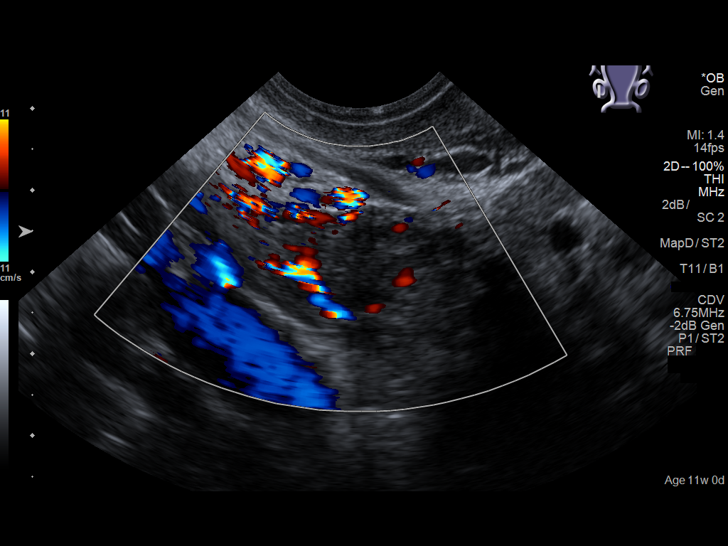
[im 139/145]
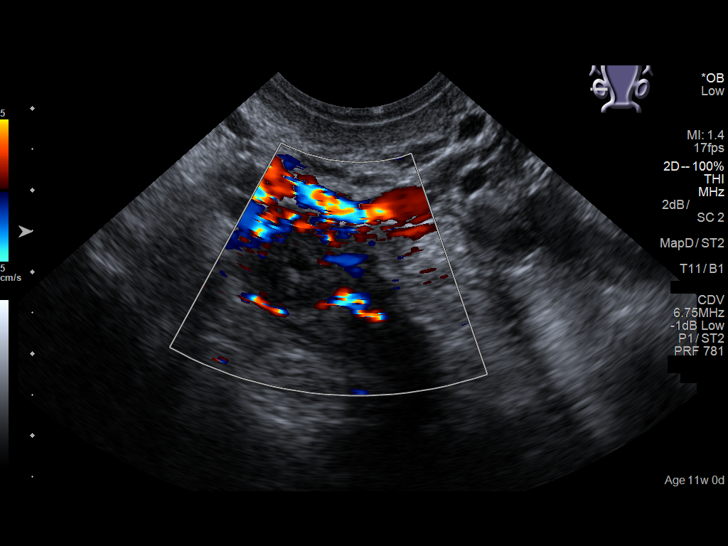

[13 of 28 positions shown; findings below may reference images not displayed]

FINDINGS: Intrauterine gestational sac: Large irregular gestational sac.

Yolk sac:  Not seen

Embryo:  Not seen

Cardiac Activity: Not seen

MSD: 22.2 mm   7 w   1 d

Subchorionic hemorrhage: Suspected moderate to large subchorionic
hemorrhage.

Maternal uterus/adnexae: Left ovary is within normal limits and
measures 2.5 x 1.3 x 1.7 cm. The right ovary measures 3.1 x 1.8 x
1.7 cm. Isoechoic area in the right ovary measuring 1.8 cm, possible
luteal body. Small amount of fluid in the cul-de-sac.
IMPRESSION: 1. Irregular fluid collection within the uterus measuring 22.2 mm,
presumably representing irregular gestational sac. No yolk sac or
fetal pole is visible. Findings are suspicious but not yet
definitive for failed pregnancy. Recommend follow-up US in 10-14
days for definitive diagnosis. This recommendation follows SRU
consensus guidelines: Diagnostic Criteria for Nonviable Pregnancy
Early in the First Trimester. N Engl J Med 2503; [DATE].
2. Appearance suspicious for moderate, possibly large subchorionic
hemorrhage.
3. 1.8 cm isodose to hypoechoic area in the right ovary possibly
representing a luteal body. Would correlate with trending of HCG and
potential short interval sonographic follow-up to exclude other
etiology of the right ovarian lesion given appearance of the
gestational sac within the uterus.
